# Patient Record
Sex: Male | Born: 1990 | Race: White | Hispanic: No | Marital: Single | Smoking: Never smoker
Health system: Southern US, Community
[De-identification: ages and names within clinical notes are randomized; demographics above are authoritative.]

## PROBLEM LIST (undated history)

## (undated) DIAGNOSIS — F840 Autistic disorder: Secondary | ICD-10-CM

## (undated) DIAGNOSIS — J45909 Unspecified asthma, uncomplicated: Secondary | ICD-10-CM

## (undated) HISTORY — PX: TONSILLECTOMY: SUR1361

---
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
## 2012-12-31 ENCOUNTER — Emergency Department: Payer: Self-pay | Admitting: Emergency Medicine

## 2014-10-16 LAB — LIPID PANEL
CHOLESTEROL: 162 mg/dL (ref 0–200)
HDL: 37 mg/dL (ref 35–70)
LDL Cholesterol: 100 mg/dL
TRIGLYCERIDES: 124 mg/dL (ref 40–160)

## 2014-11-29 DIAGNOSIS — Y998 Other external cause status: Secondary | ICD-10-CM | POA: Diagnosis not present

## 2014-11-29 DIAGNOSIS — Z7951 Long term (current) use of inhaled steroids: Secondary | ICD-10-CM | POA: Diagnosis not present

## 2014-11-29 DIAGNOSIS — Y9389 Activity, other specified: Secondary | ICD-10-CM | POA: Insufficient documentation

## 2014-11-29 DIAGNOSIS — T148 Other injury of unspecified body region: Secondary | ICD-10-CM | POA: Insufficient documentation

## 2014-11-29 DIAGNOSIS — S0993XA Unspecified injury of face, initial encounter: Secondary | ICD-10-CM | POA: Diagnosis present

## 2014-11-29 DIAGNOSIS — Y9289 Other specified places as the place of occurrence of the external cause: Secondary | ICD-10-CM | POA: Diagnosis not present

## 2014-11-29 DIAGNOSIS — S01311A Laceration without foreign body of right ear, initial encounter: Secondary | ICD-10-CM | POA: Insufficient documentation

## 2014-11-30 ENCOUNTER — Emergency Department
Admission: EM | Admit: 2014-11-30 | Discharge: 2014-11-30 | Disposition: A | Discharge status: Home / Self Care | Payer: BLUE CROSS/BLUE SHIELD | Attending: Emergency Medicine | Admitting: Emergency Medicine

## 2014-11-30 ENCOUNTER — Emergency Department: Payer: BLUE CROSS/BLUE SHIELD

## 2014-11-30 ENCOUNTER — Encounter: Payer: Self-pay | Admitting: *Deleted

## 2014-11-30 DIAGNOSIS — S01311A Laceration without foreign body of right ear, initial encounter: Secondary | ICD-10-CM | POA: Diagnosis not present

## 2014-11-30 DIAGNOSIS — T148XXA Other injury of unspecified body region, initial encounter: Secondary | ICD-10-CM

## 2014-11-30 DIAGNOSIS — IMO0002 Reserved for concepts with insufficient information to code with codable children: Secondary | ICD-10-CM

## 2014-11-30 HISTORY — DX: Autistic disorder: F84.0

## 2014-11-30 HISTORY — DX: Unspecified asthma, uncomplicated: J45.909
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
## 2014-11-30 NOTE — ED Notes (Signed)
MD at bedside. 

## 2014-11-30 NOTE — ED Notes (Signed)
Patient transported to CT 
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
## 2014-11-30 NOTE — ED Notes (Signed)
Family at bedside. 

## 2014-11-30 NOTE — ED Notes (Signed)
Pt back in room.

## 2015-09-24 DIAGNOSIS — J45990 Exercise induced bronchospasm: Secondary | ICD-10-CM | POA: Insufficient documentation

## 2015-09-24 DIAGNOSIS — E663 Overweight: Secondary | ICD-10-CM | POA: Insufficient documentation

## 2015-09-24 DIAGNOSIS — F845 Asperger's syndrome: Secondary | ICD-10-CM | POA: Insufficient documentation

## 2015-10-20 ENCOUNTER — Encounter: Payer: Self-pay | Admitting: Family Medicine

## 2015-10-20 ENCOUNTER — Ambulatory Visit (INDEPENDENT_AMBULATORY_CARE_PROVIDER_SITE_OTHER): Payer: BLUE CROSS/BLUE SHIELD | Admitting: Family Medicine

## 2015-10-20 VITALS — BP 112/54 | HR 86 | Temp 97.3°F | Resp 16 | Ht 68.0 in | Wt 204.0 lb

## 2015-10-20 DIAGNOSIS — Z0000 Encounter for general adult medical examination without abnormal findings: Secondary | ICD-10-CM

## 2015-10-20 DIAGNOSIS — F419 Anxiety disorder, unspecified: Secondary | ICD-10-CM

## 2015-10-20 DIAGNOSIS — F39 Unspecified mood [affective] disorder: Secondary | ICD-10-CM

## 2015-10-20 DIAGNOSIS — F845 Asperger's syndrome: Secondary | ICD-10-CM | POA: Diagnosis not present

## 2015-10-20 NOTE — Progress Notes (Signed)
Patient: John [name], Male    DOB: 04/24/1991, 25 y.o.   MRN: [id number] Visit Date: 10/20/2015  Today's Provider: Mila Merry, MD   Chief Complaint  Patient presents with  . Annual Exam  . Asthma   Subjective:    Annual physical exam John [name] is a 25 y.o. male who presents today for health maintenance and complete physical. He feels well. He reports exercising/some. He reports he is sleeping well.  -----------------------------------------------------------------  Follow-up for asthma from 10/16/2014; no changes. Follow-up for overweight from 10/16/2014; recommended diet.  His primary concern today is that he gets very anxious in social interactions and avoids leaving his home because of it. He also has a hard time controlling his temper and angers very easily. He would like to see a counselor to work out these issues. He is working as a part Museum/gallery exhibitions officer and states his work is going fairly well.   Review of Systems  Constitutional: Negative for fever, chills, appetite change and fatigue.  HENT: Negative for congestion, ear pain, hearing loss, nosebleeds and trouble swallowing.   Eyes: Negative for pain and visual disturbance.  Respiratory: Negative for cough, chest tightness and shortness of breath.   Cardiovascular: Negative for chest pain, palpitations and leg swelling.  Gastrointestinal: Negative for nausea, vomiting, abdominal pain, diarrhea, constipation and blood in stool.  Endocrine: Negative for polydipsia, polyphagia and polyuria.  Genitourinary: Negative for dysuria and flank pain.  Musculoskeletal: Negative for myalgias, back pain, joint swelling, arthralgias and neck stiffness.  Skin: Negative for color change, rash and wound.  Neurological: Negative for dizziness, tremors, seizures, speech difficulty, weakness, light-headedness and headaches.  Psychiatric/Behavioral: Negative for behavioral problems, confusion, sleep  disturbance, dysphoric mood and decreased concentration. The patient is not nervous/anxious.   All other systems reviewed and are negative.   Social History      He  reports that he has never smoked. He has never used smokeless tobacco. He reports that he drinks alcohol. He reports that he does not use illicit drugs.       Social History   Social History  . Marital Status: Single    Spouse Name: N/A  . Number of Children: N/A  . Years of Education: N/A   Social History Main Topics  . Smoking status: Never Smoker   . Smokeless tobacco: Never Used  . Alcohol Use: Yes     Comment: rarely  . Drug Use: No  . Sexual Activity: Yes    Birth Control/ Protection: None   Other Topics Concern  . None   Social History Narrative    Past Medical History  Diagnosis Date  . Autism   . Asthma      Patient Active Problem List   Diagnosis Date Noted  . Asperger syndrome 09/24/2015  . Asthma, exercise induced 09/24/2015  . Excess weight 09/24/2015    Past Surgical History  Procedure Laterality Date  . Tonsillectomy      Family History        Family Status  Relation Status Death Age  . Father Alive   . Paternal Grandmother Alive   . Paternal Optometrist   . Mother Alive   . Maternal Grandfather Deceased   . Brother Alive   . Maternal Grandmother Deceased         His family history includes Diabetes in his maternal grandmother and paternal grandfather; Heart attack in his maternal grandmother and paternal grandmother; Heart  disease in his mother and paternal grandfather; Hypertension in his father and mother; Stroke in his mother.    Allergies  Allergen Reactions  . Cefzil [Cefprozil] Rash  . Lorabid [Loracarbef] Rash    Previous Medications   ALBUTEROL-IPRATROPIUM (COMBIVENT) 18-103 MCG/ACT INHALER    Inhale 2 puffs into the lungs every 4 (four) hours.    Patient Care Team: Malva Limesonald E Idy Rawling, MD as PCP - General (Family Medicine)     Objective:   Vitals:  BP 112/54 mmHg  Pulse 86  Temp(Src) 97.3 F (36.3 C) (Oral)  Resp 16  Ht 5\' 8"  (1.727 m)  Wt 204 lb (92.534 kg)  BMI 31.03 kg/m2  SpO2 98%   Physical Exam   General Appearance:    Alert, cooperative, no distress, appears stated age  Head:    Normocephalic, without obvious abnormality, atraumatic  Eyes:    PERRL, conjunctiva/corneas clear, EOM's intact, fundi    benign, both eyes       Ears:    Normal TM's and external ear canals, both ears  Nose:   Nares normal, septum midline, mucosa normal, no drainage   or sinus tenderness  Throat:   Lips, mucosa, and tongue normal; teeth and gums normal  Neck:   Supple, symmetrical, trachea midline, no adenopathy;       thyroid:  No enlargement/tenderness/nodules; no carotid   bruit or JVD  Back:     Symmetric, no curvature, ROM normal, no CVA tenderness  Lungs:     Clear to auscultation bilaterally, respirations unlabored  Chest wall:    No tenderness or deformity  Heart:    Regular rate and rhythm, S1 and S2 normal, no murmur, rub   or gallop  Abdomen:     Soft, non-tender, bowel sounds active all four quadrants,    no masses, no organomegaly  Genitalia:    deferred  Rectal:    deferred  Extremities:   Extremities normal, atraumatic, no cyanosis or edema  Pulses:   2+ and symmetric all extremities  Skin:   Skin color, texture, turgor normal, no rashes or lesions  Lymph nodes:   Cervical, supraclavicular, and axillary nodes normal  Neurologic:   CNII-XII intact. Normal strength, sensation and reflexes      throughout   Depression Screen PHQ 2/9 Scores 10/20/2015  PHQ - 2 Score 1  PHQ- 9 Score 4      Assessment & Plan:     Routine Health Maintenance and Physical Exam  Exercise Activities and Dietary recommendations Goals    None      Immunization History  Administered Date(s) Administered  . Tdap 10/20/2009    Health Maintenance  Topic Date Due  . HIV Screening  10/10/2007  . TETANUS/TDAP  10/10/2011  . INFLUENZA  VACCINE  02/03/2016      Discussed health benefits of physical activity, and encouraged him to engage in regular exercise appropriate for his age and condition.    --------------------------------------------------------------------  1. Annual physical exam   2. Asperger syndrome  - Ambulatory referral to Psychology  3. Acute anxiety  - Ambulatory referral to Psychology  4. Mood disorder (HCC) Discussed medications that help level mood. He prefers to start with counseling.   - Ambulatory referral to Psychology

## 2015-10-28 ENCOUNTER — Ambulatory Visit (INDEPENDENT_AMBULATORY_CARE_PROVIDER_SITE_OTHER): Payer: BLUE CROSS/BLUE SHIELD | Admitting: Licensed Clinical Social Worker

## 2015-10-28 ENCOUNTER — Encounter: Payer: Self-pay | Admitting: Licensed Clinical Social Worker

## 2015-10-28 DIAGNOSIS — F411 Generalized anxiety disorder: Secondary | ICD-10-CM | POA: Insufficient documentation

## 2015-10-28 DIAGNOSIS — F332 Major depressive disorder, recurrent severe without psychotic features: Secondary | ICD-10-CM | POA: Insufficient documentation

## 2015-10-28 NOTE — Progress Notes (Signed)
Comprehensive Clinical Assessment (CCA) Note  10/28/2015 CONNERY SHIFFLER [id number]  Visit Diagnosis:      ICD-9-CM ICD-10-CM   1. Severe episode of recurrent major depressive disorder, without psychotic features (HCC) 296.33 F33.2   2. Generalized anxiety disorder 300.02 F41.1       CCA Part One  Part One has been completed on paper by the patient.  (See scanned document in Chart Review)  CCA Part Two A  Intake/Chief Complaint:  CCA Intake With Chief Complaint CCA Part Two Date: 10/28/15 CCA Part Two Time: 0849 Chief Complaint/Presenting Problem: he gets anxious in social situations and avoid leaving home because of it. He also doesn't have anyone to hang out with except one friend so sitting at home a lot. He has a hard time controlling his temper and angers very easily. 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 He gets depression sometimes.  Patients Currently Reported Symptoms/Problems: Right now it isn't too bad. Sometimes he feels good and sometimes feels bad. At point he was always negative.  Collateral Involvement: Benett Swoyer,  Individual's Strengths: When he has a good day he actually does good.  Individual's Preferences: therapy Individual's Abilities: He is a good gamer Type of Services Patient Feels Are Needed: individual therapy  Initial Clinical Notes/Concerns: He has had a same problem since he was kid. This is his first time in treatment and Dr. Sherrie Mustache referred him.   Mental Health Symptoms Depression:  Depression: Change in energy/activity, Fatigue, Hopelessness, Irritability, Worthlessness (Sometimes feels suicidal, had thoughts about cutting or stabbing  himself two times in the past with most recent a week ago when he held knife to arm but shared with his dad and second thought stopped him. Last year he threatened to stab himself but did not act on thoughts. out cutting himself. His second thoughts stopped him.  denies SIB He relates that these incidents were related to not getting the support and comfort he felt he needed.)  Mania:  Mania: N/A  Anxiety:   Anxiety: Difficulty concentrating, Fatigue, Irritability, Tension, Worrying (Worry daily, interferes with functioning. He worries about the small. Anxious in social situation and avoids. He worries about being evaluated. )  Psychosis:  Psychosis: N/A  Trauma:  Trauma: N/A  Obsessions:  Obsessions:  (He has thoughts that something happened and his fault. He dobule checks before you . He checks the house to make sure he leaves it safe. If something happens in the house he will be concenred that it is his fault. )  Compulsions:     Inattention:  Inattention: N/A  Hyperactivity/Impulsivity:  Hyperactivity/Impulsivity: N/A  Oppositional/Defiant Behaviors:  Oppositional/Defiant Behaviors: N/A  Borderline Personality:  Emotional Irregularity: N/A  Other Mood/Personality Symptoms:      Mental Status Exam Appearance and self-care  Stature:  Stature: Average  Weight:  Weight: Average weight  Clothing:  Clothing: Casual  Grooming:  Grooming: Normal  Cosmetic use:  Cosmetic Use: None  Posture/gait:  Posture/Gait: Normal  Motor activity:  Motor Activity: Not Remarkable  Sensorium  Attention:  Attention: Normal  Concentration:  Concentration: Normal  Orientation:  Orientation: Object, Person, Place, Situation, Time  Recall/memory:  Recall/Memory: Normal  Affect and Mood  Affect:  Affect: Appropriate  Mood:  Mood: Anxious, Depressed  Relating  Eye contact:  Eye Contact: Normal  Facial expression:  Facial Expression: Responsive  Attitude toward examiner:  Attitude Toward Examiner: Cooperative  Thought and Language  Speech flow: Speech Flow: Normal  Thought  content:  Thought Content: Appropriate to mood and circumstances  Preoccupation:     Hallucinations:     Organization:     Company secretary of Knowledge:  Fund of Knowledge: Average   Intelligence:  Intelligence: Average  Abstraction:  Abstraction: Normal  Judgement:  Judgement: Fair  Dance movement psychotherapist:  Reality Testing: Realistic  Insight:  Insight: Fair  Decision Making:  Decision Making: Normal  Social Functioning  Social Maturity:  Social Maturity: Isolates, Responsible  Social Judgement:  Social Judgement: Normal  Stress  Stressors:  Stressors: Family conflict (getting on a time schedule)  Coping Ability:  Coping Ability: Deficient supports (struggles with coping a little, lost contact with a lot of friends from high school. No support except from family. )  Skill Deficits:     Supports:      Family and Psychosocial History: Family history Marital status: Single Are you sexually active?: No What is your sexual orientation?: heterosexual Has your sexual activity been affected by drugs, alcohol, medication, or emotional stress?: no Does patient have children?: No  Childhood History:  Childhood History By whom was/is the patient raised?: Both parents Additional childhood history information: good Description of patient's relationship with caregiver when they were a child: good Patient's description of current relationship with people who raised him/her: it is going just fine. He bonds with his dad How were you disciplined when you got in trouble as a child/adolescent?: grounded a few times, has been spanked in the butt, he lost privileges. He was a pretty good kid Does patient have siblings?: Yes Number of Siblings: 1 Description of patient's current relationship with siblings: Older brother, it is complicated sometimes he has a temper problem, he wants to move out of house because he doesn't want to be around it. When he brings his friends over it makes him jealous on the inside. All he does is play video games, sleep and eat.  Did patient suffer any verbal/emotional/physical/sexual abuse as a child?: No Did patient suffer from severe childhood neglect?:  No Has patient ever been sexually abused/assaulted/raped as an adolescent or adult?: No Was the patient ever a victim of a crime or a disaster?: No Witnessed domestic violence?: No Has patient been effected by domestic violence as an adult?: No  CCA Part Two B  Employment/Work Situation: Employment / Work Psychologist, occupational Employment situation: Employed Where is patient currently employed?: [employer] How long has patient been employed?: 2 years and eight months Patient's job has been impacted by current illness: Yes Describe how patient's job has been impacted: In the last few weeks he felt like isolating from the group and taking his break by himself. this is becuase he is not very social. Sometimes he is friendly.  What is the longest time patient has a held a job?: 2 years and eight months Where was the patient employed at that time?: [employer] Has patient ever been in the [employer]?: No Has patient ever served in combat?: No Did You Receive Any Psychiatric Treatment/Services While in Equities trader?: No Are There Guns or Other Weapons in Your Home?: No  Education: Education Last Grade Completed: 12 Name of High School: [employer] Did Garment/textile technologist From [employer]?: Yes Did Theme park manager?: No Did You Attend Graduate School?: No Did You Have Any Special Interests In School?: He did play football for four years Did You Have An Individualized Education Program (IIEP): Yes Did You Have Any Difficulty At School?: Yes (Some comphrension problems. ) Were  Any Medications Ever Prescribed For These Difficulties?: No  Religion: Religion/Spirituality Are You A Religious Person?: Yes What is Your Religious Affiliation?: Christian How Might This Affect Treatment?: it would be a good thing  Leisure/Recreation: Leisure / Recreation Leisure and Hobbies: video games, draw  Exercise/Diet: Exercise/Diet Do You Exercise?: No Have You Gained or Lost A Significant Amount of Weight in  the Past Six Months?: No Do You Follow a Special Diet?: No Do You Have Any Trouble Sleeping?: No  CCA Part Two C  Alcohol/Drug Use: Alcohol / Drug Use Pain Medications: -n/a Prescriptions: -n/a Over the Counter: -n/a History of alcohol / drug use?: No history of alcohol / drug abuse                      CCA Part Three  ASAM's:  Six Dimensions of Multidimensional Assessment  Dimension 1:  Acute Intoxication and/or Withdrawal Potential:     Dimension 2:  Biomedical Conditions and Complications:     Dimension 3:  Emotional, Behavioral, or Cognitive Conditions and Complications:     Dimension 4:  Readiness to Change:     Dimension 5:  Relapse, Continued use, or Continued Problem Potential:     Dimension 6:  Recovery/Living Environment:      Substance use Disorder (SUD)    Social Function:  Social Functioning Social Maturity: Isolates, Responsible Social Judgement: Normal  Stress:  Stress Stressors: Family conflict (getting on a time schedule) Coping Ability: Deficient supports (struggles with coping a little, lost contact with a lot of friends from high school. No support except from family. ) Patient Takes Medications The Way The Doctor Instructed?: NA Priority Risk: Low Acuity  Risk Assessment- Self-Harm Potential: Risk Assessment For Self-Harm Potential Method: No plan Availability of Means: No access/NA Additional Comments for Self-Harm Potential: Patient has had thoughts at times to harm self but no plan except for a couple of times when one time he held knife to arm and once threatend to stab himself-Last week he held a knife to his arm and at 24 threatened to stab himself. This is because he is not getting the support and acceptance. He told his dad when he felt this way and agrees to tell his dad if he feels this way.   Risk Assessment -Dangerous to Others Potential: Risk Assessment For Dangerous to Others Potential Availability of Means: No access or  NA Intent: Vague intent or NA Notification Required: No need or identified person  DSM5 Diagnoses: Patient Active Problem List   Diagnosis Date Noted  . Major depressive disorder, recurrent episode, severe (HCC) 10/28/2015  . Generalized anxiety disorder 10/28/2015  . Asperger syndrome 09/24/2015  . Asthma, exercise induced 09/24/2015  . Excess weight 09/24/2015    Patient Centered Plan: Patient is on the following Treatment Plan(s):  Anxiety and Depression, social anxiety  Recommendations for Services/Supports/Treatments: Recommendations for Services/Supports/Treatments Recommendations For Services/Supports/Treatments: Individual Therapy  Treatment Plan Summary: Patient is a 25 year old male who was referred by his doctor, Dr. Sherrie MustacheFisher. He relates that he gets anxious in social situations and avoids leaving home because of it. He also doesn't have anyone to hang out with except one friend so sitting at home a lot. He has a hard time controlling his temper and angers very easily. 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 He gets depression sometimes. He endorses depressive symptoms of change in energy/activity, fatigue, hopelessness, irritability, worthlessness. He sometimes feels suicidal. On two  occassions, one last week where he held a knife to his arm but shared this with his dad and once last year when he threatened to stab himself. He agrees to talk to his dad if he has thoughts to harm himself.  He currently does not endorse any suicidal thoughts and no previous SA or SIB.  Patient's anxiety symptoms include difficulty concentrating, fatigue, irritability, tension, worrying (Worries daily, interferes with functioning). He worries about the small things. He is anxious in social situation and avoids. He worries about being evaluated. This is patient's first psychiatric experience. Patient wants to start with individual therapy before starting  medication management. He is recommended for outpatient therapy at least twice month to include but not limited to individual and family therapy. He would benefit from coping strategies to manage depression, anxiety, social anxiety and work on Building surveyor.      Referrals to Alternative Service(s): Referred to Alternative Service(s):   Place:   Date:   Time:    Referred to Alternative Service(s):   Place:   Date:   Time:    Referred to Alternative Service(s):   Place:   Date:   Time:    Referred to Alternative Service(s):   Place:   Date:   Time:     Bowman,Mary A

## 2015-11-06 ENCOUNTER — Ambulatory Visit (INDEPENDENT_AMBULATORY_CARE_PROVIDER_SITE_OTHER): Payer: BLUE CROSS/BLUE SHIELD | Admitting: Licensed Clinical Social Worker

## 2015-11-06 DIAGNOSIS — F332 Major depressive disorder, recurrent severe without psychotic features: Secondary | ICD-10-CM | POA: Diagnosis not present

## 2015-11-06 DIAGNOSIS — F411 Generalized anxiety disorder: Secondary | ICD-10-CM

## 2015-11-06 NOTE — Progress Notes (Signed)
   THERAPIST PROGRESS NOTE  Session Time:  8:50 AM-9:50 AM  Participation Level: Active  Behavioral Response: CasualAlertEuthymic  Type of Therapy: Individual Therapy  Treatment Goals addressed: Anger, Anxiety, Coping and Diagnosis: MDD, recurrent, severe, Generalized Anxiety Disorder  Interventions: CBT, Strength-based, Supportive, Anger Management Training and Other: Rapport building  Summary: John [name] is a 25 y.o. male who presents with this past week was pretty good. Last week he went bowling with his coworkers. He said he feels good when he gets out. He enjoys it. He feels that his job "is pretty good" but would like a full time. Patient enjoys going out with his family. He takes his mom out every time he has a pay check. He gets a little anxious being out with people. Mainly the people he doesn't know. Yesterday was not a good day at work. He felt sad when he got home. He says that triggers for anger are when he feels picked on and when things don't go right. He says that when he gets too angry he has the urge to fight. It doesn't happen too often. He also breaks things in the house. He is able to walk away when angry. He recognzies when he is starting to angrier because heart races and muscle tense. He said that when he sees the signs he is able to tell somebody to stop and walk away. He is avoiding situations that provoke anger. Patient worked on treatment goals and identified that he wants to work on anger management, and improvement of anxiety and depression.     Suicidal/Homicidal: No  Therapist Response: Therapist explored with patient what makes us good and bad days. Discussed that a good goal for him is call somebody to go out with every day since a good day is when he is social. Therapist asked him describe what a bad day was for him. Worked with him on anger management skills as a bad day is when he is frustrated. Explored triggers for depression, anxiety and anger to  help him in developing better coping skills. Also, discussed initiating plan for patient to be more social as a bad day is when he is by himself. Reinforced that he used problem solving skills at work. Asked if he exercises. Encouraged him to ask his friend to exercise. Worked on treatment plan and goals include anger management, improvement of anxiety and depression. Feels that depression will get better with more socialization.   Plan: Return again in 1 weeks. 2.Patient will call friend to go out with this week and will make this regular.3.He will pay attention to what makes him angry and walk away if he gets too mad.   Diagnosis: Axis I: Generalized Anxiety Disorder and Major Depression, Recurrent severe    Axis II: n/a    Bowman,Mary A, LCSW 11/06/2015

## 2015-11-13 ENCOUNTER — Ambulatory Visit (INDEPENDENT_AMBULATORY_CARE_PROVIDER_SITE_OTHER): Payer: BLUE CROSS/BLUE SHIELD | Admitting: Licensed Clinical Social Worker

## 2015-11-13 DIAGNOSIS — F332 Major depressive disorder, recurrent severe without psychotic features: Secondary | ICD-10-CM

## 2015-11-13 DIAGNOSIS — F411 Generalized anxiety disorder: Secondary | ICD-10-CM

## 2015-11-13 NOTE — Progress Notes (Signed)
   THERAPIST PROGRESS NO Session Time: 8:45 AM-9:40 AM  Participation Level: Active  Behavioral Response: CasualAlertEuthymic  Type of Therapy: Individual Therapy  Treatment Goals addressed: Anger, Anxiety, Coping and Diagnosis: MDD, recurrent, severe, Generalized Anxiety Disorder, cope with anxiety and depression as it causes him stress  Interventions: CBT, Strength-based, Supportive and Anger Management Training  Summary: John [name] is a 25 y.o. male who presents with saying that the week went pretty well but that at work yesterday it was rough but then the rest of the day went okay. His new supervisor had him doing things that he did not normally do. He was able to sit down with supervisors and resolve the problem. He learned when something goes wrong to speak up, act fast and tell supervisor. He has not had a problem with anger this week. Reviewed strategies he can use that include walking away to calm down. gs. Talked about ways he can calm down and he finds that walking is good and he identified being able to retreat to the basement. Reviewed depression and anxiety symptoms. He feels sad sometimes but not a whole lot, but then clarified and said that it goes up and down. Relates it is because he feels lonely and identified a thought that sometimes he feels like nobody cares. He did text a friend but he did not text back. Pointed out that he has to expand his social group. Reviewed different ways to meet people including volunteer work, a church group, [employer] and [email or web address]. Patient relates he will consider social medica, will text his friend and possibly church group. 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   Suicidal/Homicidal: No  Therapist Response: therapist reviewed healthy anger management strategies. Discussed that it helps to self-talk as a strategy to calm oneself down and to  help manage anger triggers when he feels picked on or things don't work the way they should work. don't go as planned. Therapist challenged patient's perspective that nobody cares and pointed out that our thoughts can be distorted and not accurate. Provided context that he misperceives because he is at home and brainstormed ways for him to connect with others. Provided supportive interventions and positive reinforcement for following up on plan from last week of calling a friend. Therapist also used strength based approach in identifying patient's interest in work as a positive quality.    Plan: 1.Return again in 1 week. 2.Patient will text a friend again to build social group.3.Patient will make a decision about either social media or church youth group as to what to follow up with in terms of building a social group.   Diagnosis: Axis I: Generalized Anxiety Disorder and Major Depression, Recurrent severe    Axis II: n/a    Linnea Todisco A, LCSW 11/13/2015

## 2015-11-20 ENCOUNTER — Ambulatory Visit (INDEPENDENT_AMBULATORY_CARE_PROVIDER_SITE_OTHER): Payer: BLUE CROSS/BLUE SHIELD | Admitting: Licensed Clinical Social Worker

## 2015-11-20 DIAGNOSIS — F332 Major depressive disorder, recurrent severe without psychotic features: Secondary | ICD-10-CM | POA: Diagnosis not present

## 2015-11-20 DIAGNOSIS — F411 Generalized anxiety disorder: Secondary | ICD-10-CM | POA: Diagnosis not present

## 2015-11-25 ENCOUNTER — Ambulatory Visit (INDEPENDENT_AMBULATORY_CARE_PROVIDER_SITE_OTHER): Payer: BLUE CROSS/BLUE SHIELD | Admitting: Licensed Clinical Social Worker

## 2015-11-25 DIAGNOSIS — F411 Generalized anxiety disorder: Secondary | ICD-10-CM | POA: Diagnosis not present

## 2015-11-25 DIAGNOSIS — F332 Major depressive disorder, recurrent severe without psychotic features: Secondary | ICD-10-CM | POA: Diagnosis not present

## 2015-11-25 NOTE — Progress Notes (Signed)
   THERAPIST PROGRESS NOTE  Session Time: 60min  Participation Level: Active  Behavioral Response: CasualAlertAnxious  Type of Therapy: Individual Therapy  Treatment Goals addressed: Communication: social skills and Coping  Interventions: CBT, Motivational Interviewing, Solution Focused and Supportive  Summary: John [name] is a 25 y.o. male who presents with symptoms of his diagnosis.  Client discussed examples of situations where there are difficulties with boundaries and boundary setting and self-assertion.  Factors that contribute to client's ongoing depressive symptoms were discussed and include real and perceived feelings of isolation, criticism, rejection, shame and guilt.    Suicidal/Homicidal: Nowithout intent/plan  Therapist Response: LCSW provided Patient with ongoing emotional support and encouragement.  Normalized her feelings.  Encouraged to meet have a conversation with  one person this week.  Plan: Return again in 1 weeks.  Diagnosis: Axis I: Generalized Anxiety Disorder and Major Depression, Recurrent severe    Axis II: No diagnosis    Marinda Elkicole M Alera Quevedo, LCSW 11/26/2015

## 2015-11-25 NOTE — Progress Notes (Signed)
   THERAPIST PROGRESS NOTE  Session Time: 60min  Participation Level: Active  Behavioral Response: CasualAlertAnxious  Type of Therapy: Individual Therapy  Treatment Goals addressed: Coping and Diagnosis: Depression  Interventions: CBT, Motivational Interviewing, Supportive and Reframing  Summary: John [name] is a 25 y.o. male who presents with symptoms of his diagnosis.  Discussion of his history and current symptoms.  Review of his previous sessions and what he is currently working on and wants to work toward.  Review of his personal goals.  Discussion of his anger/temper.   Suicidal/Homicidal: Nowithout intent/plan  Therapist Response: LCSW provided Patient with ongoing emotional support and encouragement.  Normalized his feelings.  Commended Patient on his progress and reinforced the importance of client staying focused on his own strengths and resources and resiliency. Processed various strategies for dealing with stressors.    Plan: Return again in 1 weeks.  Diagnosis: Axis I: Generalized Anxiety Disorder    Axis II: No diagnosis    Marinda Elkicole M Amilia Vandenbrink, LCSW 11/21/2015

## 2015-12-04 ENCOUNTER — Ambulatory Visit (INDEPENDENT_AMBULATORY_CARE_PROVIDER_SITE_OTHER): Payer: Medicaid Other | Admitting: Licensed Clinical Social Worker

## 2015-12-04 DIAGNOSIS — F411 Generalized anxiety disorder: Secondary | ICD-10-CM

## 2015-12-09 NOTE — Progress Notes (Signed)
   THERAPIST PROGRESS NOTE  Session Time: 30min  Participation Level: Minimal  Behavioral Response: CasualAlertAnxious  Type of Therapy: Individual Therapy  Treatment Goals addressed: Communication: styles and Diagnosis: anxiety  Interventions: CBT, Motivational Interviewing, Solution Focused, Supportive, Family Systems and Reframing  Summary: Markus DaftChristopher D Cowin is a 25 y.o. male who presents with continued symptoms of his diagnosis.  Assisted with assessing happiness, sadness, excitement, anger, disgust, and fear.  Patient explained details of their week including current stressors and worries. Discussion of how his anxiety interferes with his personal life. Patient continues to be apprehensive about talking due to change in therapist.  At times Patient becomes agitated and attempts to call his mother to avoid responding to therapist. Patient denies attempts at communicating with people in the community.  Patient refused to role play.  Patient states that he wants to continue with the previous therapist.    Suicidal/Homicidal: Nowithout intent/plan  Therapist Response: LCSW provided Patient with ongoing emotional support and encouragement.  Processed various strategies for dealing with stressors.    Plan: Return again in 1 weeks.  Diagnosis: Axis I: Generalized Anxiety Disorder    Axis II: No diagnosis    Marinda Elkicole M Peacock, LCSW 12/05/2015

## 2015-12-11 ENCOUNTER — Ambulatory Visit: Payer: No Typology Code available for payment source | Admitting: Licensed Clinical Social Worker

## 2016-02-05 ENCOUNTER — Ambulatory Visit (INDEPENDENT_AMBULATORY_CARE_PROVIDER_SITE_OTHER): Payer: BLUE CROSS/BLUE SHIELD | Admitting: Licensed Clinical Social Worker

## 2016-02-05 DIAGNOSIS — F411 Generalized anxiety disorder: Secondary | ICD-10-CM

## 2016-02-05 DIAGNOSIS — F332 Major depressive disorder, recurrent severe without psychotic features: Secondary | ICD-10-CM

## 2016-02-05 NOTE — Progress Notes (Signed)
THERAPIST PROGRESS NOTE  Session Time: 10:00 AM-10:55 AM  Participation Level: Active  Behavioral Response: CasualAlertflat affect  Type of Therapy: Individual Therapy  Treatment Goals addressed:  Anger, Anxiety, Coping and Diagnosis: MDD, recurrent, severe, Generalized Anxiety Disorder, cope with anxiety and depression as it causes him stress  Interventions: DBT, Strength-based, Anger Management Training and Family Systems, skills to develop self-esteem  Summary: John [name] is a 25 y.o. male who presents with saying that things are going pretty well. This Monday at work, he was frustrated about making a mistake at work. He took at break outside to help him with his frustrations. In terms of socially, recently he has met some new people who are starting at [employer]. He says he as no issues currently with his job. Reviewed issues with anger. In July he got upset with dad and brother. He recognizes that his trigger is when things don't go the way he think that they should. He got upset and he threw a boot at the wall. He left the situation and talked to mom and she helped calm him down. He realizes he has to work on controlling his reaction and something he is going to set his mind to every day. Related that it bothers him that parents separated. He realizes that if he was a little more open that this would help him socially. He realizes that he is more to him self. Shared in July he had suicidal thoughts, when he stopped therapy. He said that at times that he does not feel good about himself and that people would be better off without him. He agrees to share with therapist and dad if he is having these thoughts. Discussed that patient needs to implement in daily schedule activities that make him feel good and that he needs to put effort into it.   Suicidal/Homicidal: Patient reported suicidal thoughts last month. He does not endorse current SI and agrees to talk to therapist or father if  he has these thoughts.   Therapist Response: Therapist pointed out that none of [place] are perfect and that we all have to accept we will make mistakes and trying to be perfect is a losing battle. Discussed that recognizing that we make mistakes helps to put ourselves in situations that will help [place] to grow. Continue to work with patient on anger management and work with him on slowing down his response when he gets angry so that he can implement anger management skills. Gave positive feedback for the skills that patient is using to manage anger. Challenged patient on negative thinking and distortions in thinking. Explained that patient is engaging in thinking things will be permanent which is a distortion. Challenged patient on negative thoughts about self and that he has to work on thinking more positively about himself. Reviewed suicidal thoughts that patient had in the past month. Challenged distortions in thinking that led to these thoughts. Pointed out that people in his life cared about him. Developed a plan that patient will tell his father or therapist if he is having these thoughts. Helped patient process feelings related to separation of parents and build insight that relationship with parents was something they had to figure out between themselves at the same time to help patient realize that their relationship with him would not change because of their issues. Discussed the important of creating activities that increase positive emotions.    Plan: Return again in 1 week.2.Patient work on Child psychotherapist.3.Patient work on self-esteem.4.Patient  work on engaging in activities that increase positive emotions.   Diagnosis: Axis I:  Generalized Anxiety Disorder and Major Depression, Recurrent severe    Axis II: No diagnosis    Bowman,Mary A, LCSW 02/05/2016

## 2016-02-12 ENCOUNTER — Ambulatory Visit (INDEPENDENT_AMBULATORY_CARE_PROVIDER_SITE_OTHER): Payer: BLUE CROSS/BLUE SHIELD | Admitting: Licensed Clinical Social Worker

## 2016-02-12 DIAGNOSIS — F411 Generalized anxiety disorder: Secondary | ICD-10-CM

## 2016-02-12 DIAGNOSIS — F332 Major depressive disorder, recurrent severe without psychotic features: Secondary | ICD-10-CM

## 2016-02-12 NOTE — Progress Notes (Addendum)
   THERAPIST PROGRESS NOTE  Session Time: 10 AM to 10:55 AM  Participation Level: Active  Behavioral Response: CasualAlertEuthymic  Type of Therapy: Individual Therapy  Treatment Goals addressed:   Anger, Anxiety, Coping and Diagnosis: MDD, recurrent, severe, Generalized Anxiety Disorder, cope with anxiety and depression as it causes him stress  Interventions: Strength-based, Supportive and Social Skills Training  Summary: John [name] is a 25 y.o. male who presents with saying things have been pretty good. Mom mom says he is doing a pretty good job at [employer] and that helps him feel good. This will be something ongoing. He shared pictures of his 2 dogs and said that spending time with them create positive emotions. Hours have increased at work and is now going 30 hours and says he likes going to work. Talked about how this is good for him.Talked to him about and that it shows shows his strengths in being a good worker and motivationto go to work. He could not identify any special interests at this point to join a club and said he talked to his mom about services for autism and wants to put that off for now. He feels following up with people he knows will be a good way to develop friendships. Discussed self nurturing activities and he says he is saving money and takes parents out to eat. They did role play and did very well in understanding empathy and described ways has been empathetic in his own life. He also did well in understanding concepts presented in social skills handout.   Suicidal/Homicidal: No  Therapist Response: Reviewed resources that could be helpful to patient  with his issues that includes [employer] [place] [employer](through [employer]) where patient could be assigned a Microbiologistegistry Coordinator,  [employer] that offers a support group, and [employer] [place] where patient could be assigned a case Production designer, theatre/television/filmmanager. 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 Pointed out it was a sign of progress as well is keeping patient busy and providing opportunities to socialize. Encouraged patient to follow-up with friends he is running into to build relationships. Role played with patient to help patient practice this skill of empathy and discussed how that could be helpful in connecting with people. Reviewed as handout for building social skills for adults with autism. Therapist suggested joining a club group would be a way to meet people he shared similar interest or online where he could find a group where he shared similar interest or experiences. Reviewed with M.D. site and help patient normalize feelings around masturbating.  Plan: Return again in 1week.2. Continue to use role play activities to help patient work on Pharmacist, communitysocial skills.3. Patient continued to learn and implement anger management strategies 4. Therapist continue to use strength-based interventions. 5 patient take steps to continue to build support network  Diagnosis: Axis I:  Generalized Anxiety Disorder and Major Depression, Recurrent severe    Axis II: No diagnosis    Bowman,Mary A, LCSW 02/12/2016

## 2016-02-19 ENCOUNTER — Ambulatory Visit (INDEPENDENT_AMBULATORY_CARE_PROVIDER_SITE_OTHER): Payer: BLUE CROSS/BLUE SHIELD | Admitting: Licensed Clinical Social Worker

## 2016-02-19 DIAGNOSIS — F411 Generalized anxiety disorder: Secondary | ICD-10-CM

## 2016-02-19 DIAGNOSIS — F332 Major depressive disorder, recurrent severe without psychotic features: Secondary | ICD-10-CM

## 2016-02-19 NOTE — Progress Notes (Signed)
   THERAPIST PROGRESS NOTE  Session Time: 9 AM to 9:55 AM  Participation Level: Active  Behavioral Response: CasualAlertEuthymic  Type of Therapy: Individual Therapy  Treatment Goals addressed:  nger, Anxiety, Coping and Diagnosis: MDD, recurrent, severe, Generalized Anxiety Disorder, cope with anxiety and depression as it causes him stress  Interventions: CBT, Solution Focused, Strength-based and Anger Management Training  Summary: John [name] is a 25 y.o. male who presents with no reports of symptoms. He relates his mood goes up and down. If he is feeling good if he is feeling good he is at an 8 and if not feeling good a 4.  Reviewed anger worksheet on dealing with anger. Patient explains that when things don't go the right way he gets angry. He relates that when he gets angry he gets the urge to fight and that he tenses up and his heart starts beating faster. He relates to get rid of his angry feelings he takes time out her goes to a separate room. Patient identified destructive ways he has dealt with anger including physical aggression and getting an attitude with people. He relates some positive or constructive ways he has dealt with anger include walking away, chilling out and talking to somebody about what happened. Patient also watched a videos that help teach social skills including using open-ended questions, strategies to start a conversation and recognizes one's in others' personal space. Therapist role played with patient so he could practice using social skills. Patient shared as well that tomorrow will be his third year anniversary at work.  Suicidal/Homicidal: No  Therapist Response: Reiewed a  worksheet titled "Dealing with Anger" to help patient reflect on the positive and negative ways he deals with anger. Therapist asked patient to think about his thought that "things don't go the right way" and realize that his way may not be the same way as other people. Suggested a  better alternative is to discuss his frustration to work though a solution that they both can agree with. Related that we want to work on managing patient's anger so he does not lose control. Anger is a natural feeling but find ways that are healthier and more constructive to manage anger. Encourged patient to think about slowing down and taking it easy would be beneficial before he gets angry. Started a worksheet titled "anger management strategies" strategies so patient can learn more strategies that can be effective. They included finding ways to immediately lacks, changing angry thoughts by self talk, silently wanting to 10, recognize situations that trigger her anger, tune into himself to recognize warning signs, exercise and removing oneself from a situation. Presented videos to patient to help him learn effective strategies for social skills and role-played so that he could practice. Therapist provided strength-based interventions  Plan: Return again in 1 week.2. Patient work on and apply Building surveyoranger management and social skills.  Diagnosis: Axis I:  Generalized Anxiety Disorder and Major Depression, Recurrent severe    Axis II: No diagnosis    Bowman,Mary A, LCSW 02/19/2016

## 2016-02-26 ENCOUNTER — Ambulatory Visit (INDEPENDENT_AMBULATORY_CARE_PROVIDER_SITE_OTHER): Payer: BLUE CROSS/BLUE SHIELD | Admitting: Licensed Clinical Social Worker

## 2016-02-26 DIAGNOSIS — F411 Generalized anxiety disorder: Secondary | ICD-10-CM | POA: Diagnosis not present

## 2016-02-26 DIAGNOSIS — F332 Major depressive disorder, recurrent severe without psychotic features: Secondary | ICD-10-CM | POA: Diagnosis not present

## 2016-02-26 NOTE — Progress Notes (Signed)
   THERAPIST PROGRESS NOTE  Session Time: 2 PM to 2:50 PM  Participation Level: Active  Behavioral Response: CasualAlertEuthymic   Type of Therapy: Individual Therapy, collateral information was obtained through Conversation with Mom in session  Treatment Goals addressed:  Anger, Anxiety, Coping and Diagnosis: MDD, recurrent, severe, Generalized Anxiety Disorder, cope with anxiety and depression as it causes him stress   Interventions: Solution Focused, Strength-based, Supportive, Anger Management Training,   Summary: John [name] is a 25 y.o. male who presents with relating that he had a good week rate his mood as a 9 out of 10. He related he had no anger episodes. Therapist was able to talk to his mom in session by phone to help with strategies for patient to build supports. She will encourage patient to spend time with brother but also relates one of the issues in the relationship is patient's anger so patient needs to continue to address in session to help with his relationships. Brainstormed about other ways patient can develop supports and mom suggested that patient go to other churches where he would find a youth group. Patient did not have a positive experience with support groups with people who have autism. Therapist suggested that patient find an interest that he could join a group but acknowledged with mom that it's difficult for lots of people to me people. Mom talked about how much patient is overcoming his life to get to where he is now. Related that he is very sensitive and can react deeply to things. Discussed with patient after phone conversation issues with anger. He says that he tries to control anger but sometimes he just can't. Discussed his triggers and relates that related to his brother, he has an attitude problem that triggers his anger. Reviewed that when things don't go the way they should is one of his main triggers. Patient was able to recall from previous  session recognition that he is not God and can't control everything. He said that he will remind himself of this to help and de-escalating anger. Patient was also encouraged to exercise.   Suicidal/Homicidal: No  Therapist Response: Therapist was able to get collateral information from mom through phone conversation. Therapist involved mom so she could he involved in goal of helping patient to develop supports. She also was able to review patient's progress and the need to both work on anger management and to continue to work on helping him develop a support system. Reviewed triggers with patient and worked on worksheet titled "anger management strategies ". Discussed strategies the patient could use when he is triggered so that he will be better able to manage them. Discussed self-talk to change perspective, taking a deep breath and walking away is similar strategies. Provided positive feedback to reinforce progress and as a strength-based intervention.  Plan: Return again in weeks.2. Patient continue to take positive steps to progress in managing emotions.3. Patient reports one positive coping strategy he implemented from session  Diagnosis: Axis I:   Generalized Anxiety Disorder and Major Depression, Recurrent severe    Axis II: No diagnosis    Gadiel John A, LCSW 02/26/2016

## 2016-03-04 ENCOUNTER — Ambulatory Visit (INDEPENDENT_AMBULATORY_CARE_PROVIDER_SITE_OTHER): Payer: BLUE CROSS/BLUE SHIELD | Admitting: Licensed Clinical Social Worker

## 2016-03-04 ENCOUNTER — Ambulatory Visit: Payer: No Typology Code available for payment source | Admitting: Licensed Clinical Social Worker

## 2016-03-04 DIAGNOSIS — F332 Major depressive disorder, recurrent severe without psychotic features: Secondary | ICD-10-CM

## 2016-03-04 DIAGNOSIS — F411 Generalized anxiety disorder: Secondary | ICD-10-CM | POA: Diagnosis not present

## 2016-03-04 NOTE — Progress Notes (Addendum)
   THERAPIST PROGRESS NOTE  Session Time: 2 PM to 2:55 PM  Participation Level: Active  Behavioral Response: CasualAlertEuthymic  Type of Therapy: Individual Therapy  Treatment Goals addressed:  Anger, Anxiety, Coping and Diagnosis: MDD, recurrent, severe, Generalized Anxiety Disorder, cope with anxiety and depression as it causes him stress  Interventions: Solution Focused, Strength-based, Supportive and Anger Management Training  Summary: John [name] is a 25 y.o. male who presents with doing well except for being stressed out a couple of times at work. He said that he dealt with it the best he could and pretty well. Mom still has to talk to his brother about hanging out but patient feels he is making progress in talking to people. He says that he is opening up more at work and it has been positive. Continued with anger management handout. Brought up situations that cause him to be angry and worked on Cytogeneticistanger management plan. Brought up situation with dog and it bothers him that his brother is so rough on a dog. Discussed a strategy that he is going to talk to dad. Discussed as well recognition that his brother has his own anger issues to deal with to help in developing a helpful perspective to manage anger. Identified brother as anger and temperament problem. Patient discussed with therapist concerns about getting into a relationship. Patient brought up some concerns about masturbation. Therapist looked on website Healthline to help educate and normalize  Suicidal/Homicidal: No  Therapist Response: Gave patient positive feedback about talking with supports as a helpful way to manage anger. Normalized anger is emotion that everyone has and what matters is how we deal with it. Discussed that we can either handle it constructively or destructively and discuss that anger has a benefit in conveying insights about yourself and others. Therapist encouraged patient to practice self-compassion in  managing situations that are frustrating and recognize in life that things are always going the way we expect them. Discussed source of anger is lack of control and worked on building insight that we have to recognize limited control in helping to manage anger. Reviewed anger management plans with patient to help him, as a strategy to manage anger and encouraged him to see unhelpful strategies can make the situation worse. Worked on interpersonal skills related to situations that trigger anger. Discussed healthy relationship skills with patient. Reviewed education on masturbation to help normalize. Provided positive feedback for noted progress she reported making  Plan: Return again in 2 weeks.2. Patient continue to work on anger management and social skills  Diagnosis: Axis I:  Generalized Anxiety Disorder and Major Depression, Recurrent severe    Axis II: No diagnosis    Shavar Gorka A, LCSW 03/04/2016

## 2016-03-11 ENCOUNTER — Ambulatory Visit (INDEPENDENT_AMBULATORY_CARE_PROVIDER_SITE_OTHER): Payer: BLUE CROSS/BLUE SHIELD | Admitting: Licensed Clinical Social Worker

## 2016-03-11 DIAGNOSIS — F411 Generalized anxiety disorder: Secondary | ICD-10-CM | POA: Diagnosis not present

## 2016-03-11 DIAGNOSIS — F332 Major depressive disorder, recurrent severe without psychotic features: Secondary | ICD-10-CM

## 2016-03-11 NOTE — Progress Notes (Signed)
   THERAPIST PROGRESS NOTE  Session Time: 2:03 PM to 2:55 PM  Participation Level: Active  Behavioral Response: CasualAlertEuthymic  Type of Therapy: Individual Therapy  Treatment Goals addressed:  Anger, Anxiety, Coping and Diagnosis: MDD, recurrent, severe, Generalized Anxiety Disorder, cope with anxiety and depression as it causes him stress  Interventions: CBT, Solution Focused, Strength-based, Supportive, Reframing and Other: Practicing social skills, emotion regulation skills, strategies to work on self-esteem  Summary: John [name] is a 25 y.o. male who presents with doing pretty good. He reports no triggers for anger. Discussed worksheet "Pleasant Activities Tip Sheet" and talked about putting fun activities into his life. He likes [employer] golf and participating with outings at work. Discussed regular activities he enjoys including  walking the dog, has a bike, sitting outside and talked about sports team he likes. Introduced cards to help patient socialize and discuss different topics more in depth brought up by questions on the cards. He talked about wanting to be more future oriented as at times he still living in the past by still thinking about some disappointments from his past. Described himself as being perceived as dull because of staying to himself  and not socializing. He wishes he were more social but hesitant to practice suggested activity of practicing social skills and related that he is being more open at work so he is progressing. Discussed opportunity is spending time with people at work but is wants to make sure that they're not bad influences. Patient brought up topic of relationships and still has the attitude of it not being worth it for him. Discussed ways relationships could be positive, it's important to make good decisions about who to be with and not missing out on positive opportunities because of fears.    Suicidal/Homicidal: No  Therapist Response:  Reviewed "Pleasant Activities Tip Sheet and discussed that mood improves if you add some pleasant activities to the routine. Discussed that it's good to plan for at least one pleasant activity per day, even if just for a few minutes and look for activities that bring you joy and are simple. Reviewed activities that bring patient joy. Introduced cards with different topics so that patient could practice social skills. Explored clinical issues through use of the cards. Work with patient on reframing disappointments from the past and encourage him to see ways that made them stronger and more motivated to reach goals. Normalized patient's feelings around disappointment. Challenged patient on self perception through evidence that contradicts perception. Encouraged patient in practicing social skills and provided feedback for him to not let fears caused him to miss out on opportunities. Provided positive feedback for patient's practicing caution and judgment in choosing healthy relationships. Encouraged patient activities to practice socializing. Provided strength based and supportive interventions.  Plan: Return again in 1 weeks.2. Patient will utilize insights gained in therapy to help him management and mood and progress and implementing social skills.   Diagnosis: Axis I:  Generalized Anxiety Disorder and Major Depression, Recurrent severe    Axis II: No diagnosis    Allin Frix A, LCSW 03/11/2016

## 2016-03-18 ENCOUNTER — Ambulatory Visit (INDEPENDENT_AMBULATORY_CARE_PROVIDER_SITE_OTHER): Payer: BLUE CROSS/BLUE SHIELD | Admitting: Licensed Clinical Social Worker

## 2016-03-18 DIAGNOSIS — F411 Generalized anxiety disorder: Secondary | ICD-10-CM

## 2016-03-18 DIAGNOSIS — F332 Major depressive disorder, recurrent severe without psychotic features: Secondary | ICD-10-CM

## 2016-03-18 NOTE — Progress Notes (Signed)
THERAPIST PROGRESS NOTE  Session Time: 2 PM to 2:52 PM  Participation Level: Active  Behavioral Response: CasualAlertEuthymic  Type of Therapy: Individual Therapy  Treatment Goals addressed:  Anger, Anxiety, Coping and Diagnosis: MDD, recurrent, severe, Generalized Anxiety Disorder, cope with anxiety and depression as it causes him stress   Interventions: CBT, Solution Focused, Strength-based, Supportive, Reframing and Other: Work on Water quality scientistbuilding social skills, skills to work on Librarian, academicfrustration and stress management  Summary: John [name] is a 25 y.o. male who presents with relating that last week when well but brought up issue that yesterday the air conditioner went out at mom's. At first frustrated but then calmed down and had patience. He thought he was going to have the take the dog and go to dad's house. He related that he handled this better than he would've before. He recognizes that things not going the way they're supposed to is a trigger for him. Reviewed worksheet to help gain coping strategies called "13 helping points when things don't go your way". Discussed self-talk that is helpful to cope. Dicussed that this is going to happen in life fairly often and the reality that we are going to have face situations where things don't go our way such as when things break down or accidents such as forgetting to bring her things with you. It is useful in these situations that can be helpful provided that ones that work against you. Patient related that he learn from worksheet that  lot of times in life things doen't the way they supposed are to. Therapist related that this is a perspective that is helpful in coping in these type situations. Discussed positive activities of last week and he got to watch his favorite football team play. Related as well that he has somebody at work to hang out and they said to call them. Patient recognizes that this is progress and working on developing  friendships and support.  Suicidal/Homicidal: No  Therapist Response: Discussed that self-talk will help him through times when he is triggered that things are going the way they are supposed. Related that can't can't control everything in life, everything isn't perfect and getting frustrated isn't going to help. Therapist pointed out that he is going to face that situation fairly regularly just because of the reality that things happen and don't go your way. Shared that having a good perspective helps with a good approach. Reviewed worksheet "13 helping points when things don't go your way"to help gets some ideas on how to deal with setbacks. Suggestions included talking to someone to get an alternative persective. Discussed how setbacks build character. Focus on actionable stops and ask what he has learned from the problem. Worked on normalizing patients experience with dealing with unexpected problems. Played card game that introduced different topics as a way to discuss clinical issues and practice social skills. Continue to challenged patient on negative self perception. Provided positive feedback related to positive steps patient has taken in building support and in progress he is making in managing his triggers.  Plan: Return again in 2 weeks.2 patient continued to learn and apply coping strategies to manage triggers.3. Patient continue to take steps to work on building social skills.3. Patient will coordinate Mom joined session to provide support for patient and implementing coping strategies and to provide feedback on progress  Diagnosis: Axis I:   Anger, Anxiety, Coping and Diagnosis: MDD, recurrent, severe, Generalized Anxiety Disorder, cope with anxiety and depression as it causes him stress  Axis II: No diagnosis    Bowman,Mary A, LCSW 03/18/2016

## 2016-03-25 ENCOUNTER — Ambulatory Visit (INDEPENDENT_AMBULATORY_CARE_PROVIDER_SITE_OTHER): Payer: BLUE CROSS/BLUE SHIELD | Admitting: Licensed Clinical Social Worker

## 2016-03-25 DIAGNOSIS — F411 Generalized anxiety disorder: Secondary | ICD-10-CM

## 2016-03-25 DIAGNOSIS — F332 Major depressive disorder, recurrent severe without psychotic features: Secondary | ICD-10-CM

## 2016-03-25 NOTE — Progress Notes (Signed)
   THERAPIST PROGRESS NOTE  Session Time: 2:05 PM to 3 PM  Participation Level: Minimal, mom shared for majority of session with minimal input from patient  Behavioral Response: CasualAlertEuthymic  Type of Therapy:  session with patient and mom who provided input to help and working on treatment goals  Treatment Goals addressed: Anger, Anxiety, Coping and Diagnosis: MDD, recurrent, severe, Generalized Anxiety Disorder, cope with anxiety and depression as it causes him stress  Interventions: Solution Focused, Strength-based, Supportive, Anger Management Training, Family Systems, Reframing and Other: Skills in interpersonal effectiveness  Summary: Markus DaftChristopher D Kuba is a 25 y.o. male who presents with mom to give feedback as to progress and identify any issues that need addressed. Mom noted progress in saying that patient is taking steps to advocate for himself, communicating and not getting upset. She pointed out that he has recently done that at work. Pointed out that this is a valuable life skill and when we all have to develop. Mom related that patient has worked on Research officer, political partydeveloping social skills because he wanted to be like everyone else. He watches people and is attuned to them. She says it's harder now for him because he is not in his school environment and not around people his age. Brainstormed about ways he could connect with people including friends on [employer]. Related that it can be hard for anyone to develop relationships when they're no longer in an environment where people is easy. Mom feels it would be good for patient to develop relationship with brother and that they love each other. Patient shared he is recently touch base with person from work who is not getting back to him. Discussed that we have to learn not to personalize and only valuing relationships where the person values us. Discussed patient's feeling of not wanting to be in the relationship because he does not want is hard  to be broken. Shared how relationships are worth investing in and even though heart can be broken we learn, grow and ultimately when we love life is more fulfilling.   Suicidal/Homicidal: No  Therapist Response: Held session with patient and patient's mom and reviewed patient's progress. Identified patient's steps to advocate for himself and indication of progress. Mom is going to encourage for patient and his brother to spend time together and reviewed triggers so patient would be able to manage frustrations with his brother better. Encouraged patient to be more like brother and then a parent to help him with frustrations. Discussed the value of taking risks and the value of being in relationships. Related that patient can work on himself while single and realized his life can be fulfilling by what he brings to his life. Reviewed anger management strategies that include stepping back and walking away until one is calmer, recognizing when he starts to get angry or so he can use coping strategies, and getting context by thinking about that makes you angry more rationally than emotionally taking. Related that anger is normal for everyone and we have to find ways to handle it constructively rather than destructively. Used strength and supportive interventions.  Plan: Return again in 2 weeks.2. Patient work on Building surveyoranger management and social skills.  Diagnosis: Axis I: Anger, Anxiety, Coping and Diagnosis: MDD, recurrent, severe, Generalized Anxiety Disorder, cope with anxiety and depression as it causes him stress    Axis II: No diagnosis    Rhyan Wolters A, LCSW 03/25/2016

## 2016-04-08 ENCOUNTER — Ambulatory Visit: Payer: No Typology Code available for payment source | Admitting: Licensed Clinical Social Worker

## 2016-04-15 ENCOUNTER — Ambulatory Visit (INDEPENDENT_AMBULATORY_CARE_PROVIDER_SITE_OTHER): Payer: BLUE CROSS/BLUE SHIELD | Admitting: Licensed Clinical Social Worker

## 2016-04-15 DIAGNOSIS — F332 Major depressive disorder, recurrent severe without psychotic features: Secondary | ICD-10-CM | POA: Diagnosis not present

## 2016-04-15 DIAGNOSIS — F411 Generalized anxiety disorder: Secondary | ICD-10-CM | POA: Diagnosis not present

## 2016-04-15 NOTE — Progress Notes (Signed)
   THERAPIST PROGRESS NOTE  Session John: 3 PM to 3:55 PM  Participation Level: Active  Behavioral Response: CasualAlertEuthymic  Type of Therapy: Individual Therapy  Treatment Goals addressed:  Anger, Anxiety, Coping and Diagnosis: MDD, recurrent, severe, Generalized Anxiety Disorder, cope with anxiety and depression as it causes him stress  Interventions: CBT, Solution Focused, Strength-based, Supportive, Anger Management Training and Other: Compassion-based therapy  Summary: John [name] is [name] 25 y.o. male who presents with not feeling so good today. [name] lot of times at work he doesn't feel like [name] friendly person even though he should be. He is tired and got up early for work this week. He is willing to just say hi to people to be friendly even though irritable. Brought up topic of masturbating and said that it can make him feel depressed and aggressive and thinks he needs to cut down. He thinks that giving it up can make him more energetic and productive. He discussed with his dad he said he should give it up if it doesn't make him feel good. Patient still is thinking about what he wants to do and if he wants to stop. Reviewed John [name] talk by John [name] titled "Anger, Compassion, and what it means to be strong" and discussed how compassion is courageous and compassion for herself as [name] strategy to manage anger. Discussed the strategies suggested in video that includes take [name] moment to be compassionate to self to notice what is happening and understand why you are struggling, and then slow things down. The video suggested deep breathing as body will help slow down mind and that we don't have to act even though her anger may tell us this. Then work on emotions before taking action. Patient applied strategy to [name] past situation and he describes he will tell himself "it's not [name] big deal and move on". Therapist emphasized being compassionate as well with himself  Suicidal/Homicidal:  No  Therapist Response: Reviewed patient's current symptoms an normalized his irritablity is something people feel from John to John and discussed strategies so he won't take that out on anyone else. Explained that patient has to accept he is not perfect and feeling of irritability are normal. Provided psychoeducation about masturbation, normalized and reviewed pros and cons and helping patient come to decision about it. Reviewed John [name] talk on "Anger, Compassion, and what it means to be strong" by John [name]. Discussed  compassion as [name] means to be strong and how compassion towards self is [name] helpful strategy in managing anger. Identified patient's core beliefs of not feeling good enough but at the same John discussing his perfectionist standards as impossible to me. Provided supportive and strength-based interventions.  Plan: Return again in 1 weeks.2. Patient work on strategies to improve on self-esteem.2. Patient gained insight and implement strategies to manage anger  Diagnosis: Axis I:  Anger, Anxiety, Coping and Diagnosis: MDD, recurrent, severe, Generalized Anxiety Disorder, cope with anxiety and depression as it causes him stress    Axis II: No diagnosis    John [name] A, LCSW 04/15/2016

## 2016-04-22 ENCOUNTER — Ambulatory Visit (INDEPENDENT_AMBULATORY_CARE_PROVIDER_SITE_OTHER): Payer: BLUE CROSS/BLUE SHIELD | Admitting: Licensed Clinical Social Worker

## 2016-04-22 DIAGNOSIS — F332 Major depressive disorder, recurrent severe without psychotic features: Secondary | ICD-10-CM | POA: Diagnosis not present

## 2016-04-22 DIAGNOSIS — F411 Generalized anxiety disorder: Secondary | ICD-10-CM | POA: Diagnosis not present

## 2016-04-22 NOTE — Progress Notes (Addendum)
   THERAPIST PROGRESS NOTE  Session Time: 2 PM to 2:50 PM  Participation Level: Active  Behavioral Response: CasualAlertEuthymic  Type of Therapy: Individual Therapy  Treatment Goals addressed:  Anger, Anxiety, Coping and Diagnosis: MDD, recurrent, severe, Generalized Anxiety Disorder, cope with anxiety and depression as it causes him stress  Interventions: Solution Focused, Strength-based, Supportive, Anger Management Training, Psychologist, occupationalocial Skills Training and Other: Skills to build self-esteem, emotional regulation  Summary: John DaftChristopher D [name] is a 25 y.o. male who presents with feeling good right now. Sometimes feels depressed. Sometimes he wishes he could do better. in reviewing what patient, he explains that he wants to make more friends. He has spent some time with his brother and he was encouraged that he can made his friends to help build relationships. Therapist also encouraged him to look at making friends in his different activities because it well increase his chances of meeting people. He related sometimes he feels good and sometimes he thinks he is not worthy of getting a girlfriend. He doesn't pick up cues that they are interested. Reviewed video on nonverbal communication to help patient be better at picking up cues and better at communicating himself. Identified the patient has self-esteem but that working on improving self-esteem would be helpful for focus of treatment. Reviewed worksheet "The basics of human worth" to help patient understand unconditional worth and not base it on externals. Discussed ways patient can work on self-esteem and he identified losing weight. Therapist suggested giving himself or one thing that is positive. Reviewed video "why do we lose control over emotions" to help patient understand the brain better to help in managing anger and overwhelming emotions.  self-esteem as a focus of treatment  Things t discussed o do to lose weight. Say something positive to  himself.   Suicidal/Homicidal: No  Therapist Response: Reviewed symptoms and identified the patient does not pick up cues that girls are interested so reviewed a video that described types of nonverbal communication to help improve patient's social skills. Identified source of depression as not having friends so reviewed strategies where he could meet people. Identified self-esteem issues so reviewed handout with patient "the basics of human worth" discussed that everybody has unconditional self-worth. Reviewed video "why do we lose control of her emotions" to help patient learn more about how the brain works in order for him to develop skills in anger management and emotional regulation. Provided supportive and strength-based interventions  Plan: Return again in 1 weeks.2.patient give himself credit for one positive thing a day.3.patient continued to gain insight and practice social skills.4.patient learn and implement emotional regulation skills   Diagnosis: Axis I:  MDD, recurrent, severe, Generalized Anxiety Disorder    Axis II: No diagnosis    Culver Feighner A, LCSW 04/22/2016

## 2016-04-29 ENCOUNTER — Ambulatory Visit (INDEPENDENT_AMBULATORY_CARE_PROVIDER_SITE_OTHER): Payer: BLUE CROSS/BLUE SHIELD | Admitting: Licensed Clinical Social Worker

## 2016-04-29 DIAGNOSIS — F411 Generalized anxiety disorder: Secondary | ICD-10-CM

## 2016-04-29 DIAGNOSIS — F332 Major depressive disorder, recurrent severe without psychotic features: Secondary | ICD-10-CM

## 2016-04-29 NOTE — Progress Notes (Signed)
   THERAPIST PROGRESS NOTE  Session Time: 2 PM to 2:55 PM  Participation Level: Active  Behavioral Response: CasualAlertEuthymic and Irritable  Type of Therapy: Individual Therapy  Treatment Goals addressed:  Anger, Anxiety, Coping and Diagnosis: MDD, recurrent, severe, Generalized Anxiety Disorder, cope with anxiety and depression as it causes him stress  Interventions: CBT, Strength-based and Supportive  Summary: John [name] is a 25 y.o. male who presents with something going through his head for past few days. He doesn't believe in true love. It gets him mad  because he believes it is something that people make up and not true. Therapist talked to patient about how love is often presented in an idealized way in movies and that in reality people love each other but it requires work and effort. Patient also shared that he had plans with his friend who canceled plans because he had other plans with another friend. Patient now thinks that he had made up a story that was not true and canceling plans. Therapist introduced worksheet "negative self talk" to help him challenge this negative thoughts and looking at evidence that does not support the thought. Talked in alternative thought that was more realistic and positive. Patient had trouble believing in alternative thoughts and therapist related that it takes time and practice to change habits of thinking. Patient identified being to conclusions and thinking the worst as one of his thought distortions and willing to work on it. Patient relates work is going really well and trying to connect with people socially. at, work is going well, trying to connect with people socially,    Suicidal/Homicidal: No  Therapist Response: Reviewed symptoms and progress of patient. Help patient process his feelings of anger. Completed "negative thought stopping" worksheet and used CBT techniques to help patient identify trigger, negative thought, develop an  alternative thought that is more realistic and positive. CBT strategies will help patient change self talk to thoughts are more positive  Identified and educated patient on thought distortions in describing jumping to conclusions and all or nothing thinking. Discussed healthy relationship skills related to setting boundaries. Discussed that setting boundaries is a healthy relationship skill but patient realizes that in trusting people, he does jump to conclusions and makes negative predictions about the future. Discussed CBT strategy takes time and practice and the patient is retraining has brain to develop healthier habits to help with his emotions. Provided supportive and strength-based interventions. Help patient process his emotions around anger.  Plan: Return again in 1 weeks.2. Patient views technique of challenging negative thoughts and identifying thought distortions to help with managing his emotions.   Diagnosis: Axis I:  generalized anxiety disorder, severe episode of recurrent major depressive disorder, without psychotic features    Axis II: No diagnosis    Jovan Colligan A, LCSW 04/29/2016

## 2016-05-06 ENCOUNTER — Telehealth: Payer: Self-pay | Admitting: Family Medicine

## 2016-05-06 NOTE — Telephone Encounter (Signed)
Ok for referral to continue psychology therapy as requested below.

## 2016-05-06 NOTE — Telephone Encounter (Signed)
[name]'s therapist  at  [facility] called needing a new referral for John [name] to have additional therapy.    He has had 16 sessions and she feels he needs continue but in order for insurance to pay he needs a doctors recommendation or order.  Contact number is [phone]  Thanks

## 2016-05-06 NOTE — Telephone Encounter (Signed)
Please review. KW 

## 2016-05-07 NOTE — Telephone Encounter (Signed)
[facility] [place] [facility] who states no Berkley Harveyauth is needed for pt to be seen by mental health.Referance # [id number].I also spoke with Denny PeonKim M. at [facility] who states there is no visit limit,no authorization  Needed for mental health.Referance # [id number].Pt advised

## 2016-06-02 ENCOUNTER — Telehealth: Payer: Self-pay | Admitting: Licensed Clinical Social Worker

## 2016-06-02 NOTE — Telephone Encounter (Signed)
Therapist discussed with mom that therapist called Medicaid for authorization for more services and was [facility]. Mom related that therapist needs to get authorization for co-pay for [facility]. She is calling tomorrow to give therapist right [facility] number for authorization for co-pay.

## 2016-06-14 ENCOUNTER — Telehealth (HOSPITAL_COMMUNITY): Payer: Self-pay | Admitting: Licensed Clinical Social Worker

## 2016-06-14 NOTE — Telephone Encounter (Signed)
06/11/16  Therapist contacted Medicaid provider services line once had received clarification for patient's mom that authorization was being sought for secondary insurance for co-pay. Called [phone]. Representative was unfamiliar with therapist request and referred therapists to financial services and claims at [phone]. Representative not sure about the request and gave  email address for utilization review. Therapist unable to send email even after reviewed correct email address with representative from insurance. (email address: [email or web address].) Therapist given number for access by our UR department at [phone] and talked to representatives Patience. She was unsure with what the issue was and did not think there was a problem as claims had been submitted for services and paid so far. She gave therapist contact name and number for person who would be able to provide more information and know more about necessity of pre certifying for secondary co-pay. Therapist emailed [email or web address] as well as leaving a message at her phone number [phone]

## 2016-06-15 ENCOUNTER — Telehealth (HOSPITAL_COMMUNITY): Payer: Self-pay | Admitting: Licensed Clinical Social Worker

## 2016-06-15 NOTE — Telephone Encounter (Signed)
Therapist contacted patient's mother, John [name], and related information she found from research of contacting financial, access and [employer]. Related that therapist was contacted by Garnet KoyanagiMalinda Lyerly from [employer] through email related that she asked around and it is her  understanding in [employer] that therapist didn't need to do anything. The only thing she could think of is if he has behavioral health benefits under [employer], you would need to bill them first for a denial before you bill Medicaid.  As long as the member has unmanaged visits left, they typically don't authorize visits. He has 14 remaining. It looks like previous OPT claims have gone through ok. Therapist gave mom number for access at [phone] for her to look into as well.

## 2016-06-25 ENCOUNTER — Encounter: Payer: Self-pay | Admitting: Family Medicine

## 2016-06-25 ENCOUNTER — Ambulatory Visit (INDEPENDENT_AMBULATORY_CARE_PROVIDER_SITE_OTHER): Payer: BLUE CROSS/BLUE SHIELD | Admitting: Family Medicine

## 2016-06-25 VITALS — BP 124/70 | HR 84 | Temp 98.1°F | Resp 16 | Ht 68.0 in | Wt 209.0 lb

## 2016-06-25 DIAGNOSIS — B9789 Other viral agents as the cause of diseases classified elsewhere: Secondary | ICD-10-CM

## 2016-06-25 DIAGNOSIS — J069 Acute upper respiratory infection, unspecified: Secondary | ICD-10-CM

## 2016-06-25 NOTE — Progress Notes (Signed)
Patient: John [name] Male    DOB: 11/23/1990   25 y.o.   MRN: [id number] Visit Date: 06/25/2016  Today's Provider: Mila Merryonald Fisher, MD   Chief Complaint  Patient presents with  . Cough   Subjective:    Cough started yesterday with dry throat and runny nose. Patient stated that symptoms have gradually worsened. He has taken otc ibuprofen and Oscillococcinum with mild relief.    Cough  This is a new problem. The current episode started yesterday. The problem has been gradually worsening. The problem occurs every few minutes. The cough is productive of sputum. Associated symptoms include nasal congestion, postnasal drip, rhinorrhea and a sore throat (dry throat). Pertinent negatives include no chest pain, chills, ear congestion, ear pain, fever, headaches, heartburn, hemoptysis, myalgias, rash, shortness of breath, sweats, weight loss or wheezing. Treatments tried: ibuprofen and oscillococcinum. The treatment provided mild relief. There is no history of asthma, bronchiectasis, bronchitis, COPD, emphysema, environmental allergies or pneumonia.       Allergies  Allergen Reactions  . Cefzil [Cefprozil] Rash  . Lorabid [Loracarbef] Rash     Current Outpatient Prescriptions:  .  albuterol-ipratropium (COMBIVENT) 18-103 MCG/ACT inhaler, Inhale 2 puffs into the lungs every 4 (four) hours., Disp: , Rfl:   Review of Systems  Constitutional: Negative for appetite change, chills, fever and weight loss.  HENT: Positive for congestion, postnasal drip, rhinorrhea, sneezing, sore throat (dry throat) and voice change. Negative for ear pain, sinus pain and sinus pressure.   Respiratory: Positive for cough. Negative for hemoptysis, chest tightness, shortness of breath and wheezing.   Cardiovascular: Negative for chest pain and palpitations.  Gastrointestinal: Negative for abdominal pain, heartburn, nausea and vomiting.  Musculoskeletal: Negative for myalgias.  Skin: Negative for  rash.  Allergic/Immunologic: Negative for environmental allergies.  Neurological: Negative for headaches.    Social History  Substance Use Topics  . Smoking status: Never Smoker  . Smokeless tobacco: Never Used  . Alcohol use No     Comment: rarely   Objective:   BP 124/70 (BP Location: Right Arm, Patient Position: Sitting, Cuff Size: Large)   Pulse 84   Temp 98.1 F (36.7 C) (Oral)   Resp 16   Ht 5\' 8"  (1.727 m)   Wt 209 lb (94.8 kg)   SpO2 97%   BMI 31.78 kg/m   Physical Exam  General Appearance:    Alert, cooperative, no distress  HENT:   bilateral TM normal without fluid or infection, neck without nodes, pharynx erythematous without exudate, sinuses nontender, post nasal drip noted, nasal mucosa congested and nasal mucosa pale and congested  Eyes:    PERRL, conjunctiva/corneas clear, EOM's intact       Lungs:     Clear to auscultation bilaterally, respirations unlabored  Heart:    Regular rate and rhythm  Neurologic:   Awake, alert, oriented x 3. No apparent focal neurological           defect.           Assessment & Plan:     1. Viral upper respiratory tract infection Counseled regarding signs and symptoms of viral and bacterial respiratory infections. Advised to call or return for additional evaluation if he develops any sign of bacterial infection, or if current symptoms last longer than 10 days.        The entirety of the information documented in the History of Present Illness, Review of Systems and Physical Exam were personally obtained by  me. Portions of this information were initially documented by April M. Hyacinth MeekerMiller, CMA and reviewed by me for thoroughness and accuracy.    Mila Merryonald Fisher, MD  [facility]

## 2016-06-25 NOTE — Patient Instructions (Signed)
You can take OTC Mucinex (guaifenesin) for chest or sinus congestion and to help your cough    Upper Respiratory Infection, Adult Most upper respiratory infections (URIs) are a viral infection of the air passages leading to the lungs. A URI affects the nose, throat, and upper air passages. The most common type of URI is nasopharyngitis and is typically referred to as "the common cold." URIs run their course and usually go away on their own. Most of the time, a URI does not require medical attention, but sometimes a bacterial infection in the upper airways can follow a viral infection. This is called a secondary infection. Sinus and middle ear infections are common types of secondary upper respiratory infections. Bacterial pneumonia can also complicate a URI. A URI can worsen asthma and chronic obstructive pulmonary disease (COPD). Sometimes, these complications can require emergency medical care and may be life threatening. What are the causes? Almost all URIs are caused by viruses. A virus is a type of germ and can spread from one person to another. What increases the risk? You may be at risk for a URI if:  You smoke.  You have chronic heart or lung disease.  You have a weakened defense (immune) system.  You are very young or very old.  You have nasal allergies or asthma.  You work in crowded or poorly ventilated areas.  You work in health care facilities or schools. What are the signs or symptoms? Symptoms typically develop 2-3 days after you come in contact with a cold virus. Most viral URIs last 7-10 days. However, viral URIs from the influenza virus (flu virus) can last 14-18 days and are typically more severe. Symptoms may include:  Runny or stuffy (congested) nose.  Sneezing.  Cough.  Sore throat.  Headache.  Fatigue.  Fever.  Loss of appetite.  Pain in your forehead, behind your eyes, and over your cheekbones (sinus pain).  Muscle aches. How is this  diagnosed? Your health care provider may diagnose a URI by:  Physical exam.  Tests to check that your symptoms are not due to another condition such as:  Strep throat.  Sinusitis.  Pneumonia.  Asthma. How is this treated? A URI goes away on its own with time. It cannot be cured with medicines, but medicines may be prescribed or recommended to relieve symptoms. Medicines may help:  Reduce your fever.  Reduce your cough.  Relieve nasal congestion. Follow these instructions at home:  Take medicines only as directed by your health care provider.  Gargle warm saltwater or take cough drops to comfort your throat as directed by your health care provider.  Use a warm mist humidifier or inhale steam from a shower to increase air moisture. This may make it easier to breathe.  Drink enough fluid to keep your urine clear or pale yellow.  Eat soups and other clear broths and maintain good nutrition.  Rest as needed.  Return to work when your temperature has returned to normal or as your health care provider advises. You may need to stay home longer to avoid infecting others. You can also use a face mask and careful hand washing to prevent spread of the virus.  Increase the usage of your inhaler if you have asthma.  Do not use any tobacco products, including cigarettes, chewing tobacco, or electronic cigarettes. If you need help quitting, ask your health care provider. How is this prevented? The best way to protect yourself from getting a cold is to practice  good hygiene.  Avoid oral or hand contact with people with cold symptoms.  Wash your hands often if contact occurs. There is no clear evidence that vitamin C, vitamin E, echinacea, or exercise reduces the chance of developing a cold. However, it is always recommended to get plenty of rest, exercise, and practice good nutrition. Contact a health care provider if:  You are getting worse rather than better.  Your symptoms are  not controlled by medicine.  You have chills.  You have worsening shortness of breath.  You have brown or red mucus.  You have yellow or brown nasal discharge.  You have pain in your face, especially when you bend forward.  You have a fever.  You have swollen neck glands.  You have pain while swallowing.  You have white areas in the back of your throat. Get help right away if:  You have severe or persistent:  Headache.  Ear pain.  Sinus pain.  Chest pain.  You have chronic lung disease and any of the following:  Wheezing.  Prolonged cough.  Coughing up blood.  A change in your usual mucus.  You have a stiff neck.  You have changes in your:  Vision.  Hearing.  Thinking.  Mood. This information is not intended to replace advice given to you by your health care provider. Make sure you discuss any questions you have with your health care provider. Document Released: 12/15/2000 Document Revised: 02/22/2016 Document Reviewed: 09/26/2013 Elsevier Interactive Patient Education  2017 [employer].

## 2016-07-08 ENCOUNTER — Ambulatory Visit: Payer: Self-pay | Admitting: Licensed Clinical Social Worker

## 2016-07-22 ENCOUNTER — Ambulatory Visit: Payer: BLUE CROSS/BLUE SHIELD | Admitting: Licensed Clinical Social Worker

## 2016-08-03 ENCOUNTER — Encounter: Payer: Self-pay | Admitting: Family Medicine

## 2016-08-03 ENCOUNTER — Ambulatory Visit (INDEPENDENT_AMBULATORY_CARE_PROVIDER_SITE_OTHER): Payer: BLUE CROSS/BLUE SHIELD | Admitting: Family Medicine

## 2016-08-03 VITALS — BP 110/68 | HR 97 | Temp 97.7°F | Resp 18 | Wt 208.0 lb

## 2016-08-03 DIAGNOSIS — A084 Viral intestinal infection, unspecified: Secondary | ICD-10-CM

## 2016-08-03 MED ORDER — PROMETHAZINE HCL 25 MG PO TABS: 25.0000 mg | ORAL_TABLET | Freq: Three times a day (TID) | ORAL | 0 refills | Status: DC | PRN

## 2016-08-03 NOTE — Progress Notes (Signed)
       Patient: John DaftChristopher D Zhan Male    DOB: 11/20/1990   25 y.o.   MRN: [id number] Visit Date: 08/03/2016  Today's Provider: Mila Merryonald Twan Harkin, MD   Chief Complaint  Patient presents with  . Vomiting   Subjective:    HPI Vomiting and Diarrhea:  Patient comes in today reporting that since yesterday morning he has had several episodes of Diarrhea and vomiting. Patient has also had abdominal pain, cough, chills and nausea. Patient states he felt like he had a fever but didn't have a thermometer to check.     Allergies  Allergen Reactions  . Cefzil [Cefprozil] Rash  . Lorabid [Loracarbef] Rash     Current Outpatient Prescriptions:  .  albuterol-ipratropium (COMBIVENT) 18-103 MCG/ACT inhaler, Inhale 2 puffs into the lungs every 4 (four) hours., Disp: , Rfl:   Review of Systems  Constitutional: Positive for appetite change. Negative for chills and fever.  HENT: Negative for mouth sores, nosebleeds, postnasal drip, rhinorrhea, sinus pain, sinus pressure, sneezing and sore throat.   Respiratory: Positive for cough (dry). Negative for chest tightness, shortness of breath and wheezing.   Cardiovascular: Negative for chest pain and palpitations.  Gastrointestinal: Positive for abdominal pain, diarrhea and nausea. Negative for anal bleeding and vomiting.    Social History  Substance Use Topics  . Smoking status: Never Smoker  . Smokeless tobacco: Never Used  . Alcohol use Yes     Comment: rarely   Objective:   BP 110/68 (BP Location: Left Arm, Patient Position: Sitting, Cuff Size: Large)   Pulse 97   Temp 97.7 F (36.5 C) (Oral)   Resp 18   Wt 208 lb (94.3 kg)   SpO2 96% Comment: room air  BMI 31.63 kg/m   Physical Exam  General Appearance:    Alert, cooperative, no distress  Eyes:    PERRL, conjunctiva/corneas clear, EOM's intact       Lungs:     Clear to auscultation bilaterally, respirations unlabored  Heart:    Regular rate and rhythm  Abdomen:   soft or round.  No CVA tenderness. Mild generalized tenderness. No rebound, no guarding.         Assessment & Plan:     1. Gastroenteritis and colitis, viral Improving today. Counseled on prudent diet, push clear fluids. Call if not steadily improving.  - promethazine (PHENERGAN) 25 MG tablet; Take 1 tablet (25 mg total) by mouth every 8 (eight) hours as needed for nausea or vomiting.  Dispense: 10 tablet; Refill: 0     The entirety of the information documented in the History of Present Illness, Review of Systems and Physical Exam were personally obtained by me. Portions of this information were initially documented by Awilda Billoshena Chambers, CMA and reviewed by me for thoroughness and accuracy.    Mila Merryonald Akelia Husted, MD  [facility]

## 2016-08-03 NOTE — Patient Instructions (Addendum)
Viral Gastroenteritis, Adult Introduction Viral gastroenteritis is also known as the stomach flu. This condition is caused by certain germs (viruses). These germs can be passed from person to person very easily (are very contagious). This condition can cause sudden watery poop (diarrhea), fever, and throwing up (vomiting). Having watery poop and throwing up can make you feel weak and cause you to get dehydrated. Dehydration can make you tired and thirsty, make you have a dry mouth, and make it so you pee (urinate) less often. Older adults and people with other diseases or a weak defense system (immune system) are at higher risk for dehydration. It is important to replace the fluids that you lose from having watery poop and throwing up. Follow these instructions at home: Follow instructions from your doctor about how to care for yourself at home. Eating and drinking Follow these instructions as told by your doctor:  Take an oral rehydration solution (ORS). This is a drink that is sold at pharmacies and stores.  Drink clear fluids in small amounts as you are able, such as:  Water.  Ice chips.  Diluted fruit juice.  Low-calorie sports drinks.  Eat bland, easy-to-digest foods in small amounts as you are able, such as:  Bananas.  Applesauce.  Rice.  Low-fat (lean) meats.  Toast.  Crackers.  Avoid fluids that have a lot of sugar or caffeine in them.  Avoid alcohol.  Avoid spicy or fatty foods. General instructions  Drink enough fluid to keep your pee (urine) clear or pale yellow.  Wash your hands often. If you cannot use soap and water, use hand sanitizer.  Make sure that all people in your home wash their hands well and often.  Rest at home while you get better.  Take over-the-counter and prescription medicines only as told by your doctor.  Watch your condition for any changes.  Take a warm bath to help with any burning or pain from having watery poop.  Keep all  follow-up visits as told by your doctor. This is important. Contact a doctor if:  You cannot keep fluids down.  Your symptoms get worse.  You have new symptoms.  You feel light-headed or dizzy.  You have muscle cramps. Get help right away if:  You have chest pain.  You feel very weak or you pass out (faint).  You see blood in your throw-up.  Your throw-up looks like coffee grounds.  You have bloody or black poop (stools) or poop that look like tar.  You have a very bad headache, a stiff neck, or both.  You have a rash.  You have very bad pain, cramping, or bloating in your belly (abdomen).  You have trouble breathing.  You are breathing very quickly.  Your heart is beating very quickly.  Your skin feels cold and clammy.  You feel confused.  You have pain when you pee.  You have signs of dehydration, such as:  Dark pee, hardly any pee, or no pee.  Cracked lips.  Dry mouth.  Sunken eyes.  Sleepiness.  Weakness. This information is not intended to replace advice given to you by your health care provider. Make sure you discuss any questions you have with your health care provider. Document Released: 12/08/2007 Document Revised: 01/09/2016 Document Reviewed: 02/25/2015  2017 Elsevier  

## 2016-08-05 ENCOUNTER — Ambulatory Visit (INDEPENDENT_AMBULATORY_CARE_PROVIDER_SITE_OTHER): Payer: BLUE CROSS/BLUE SHIELD | Admitting: Licensed Clinical Social Worker

## 2016-08-05 DIAGNOSIS — F332 Major depressive disorder, recurrent severe without psychotic features: Secondary | ICD-10-CM

## 2016-08-05 DIAGNOSIS — F411 Generalized anxiety disorder: Secondary | ICD-10-CM | POA: Diagnosis not present
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
## 2016-08-19 ENCOUNTER — Ambulatory Visit (INDEPENDENT_AMBULATORY_CARE_PROVIDER_SITE_OTHER): Payer: BLUE CROSS/BLUE SHIELD | Admitting: Licensed Clinical Social Worker

## 2016-08-19 DIAGNOSIS — F411 Generalized anxiety disorder: Secondary | ICD-10-CM | POA: Diagnosis not present

## 2016-08-19 DIAGNOSIS — F332 Major depressive disorder, recurrent severe without psychotic features: Secondary | ICD-10-CM

## 2016-08-19 NOTE — Progress Notes (Signed)
   THERAPIST PROGRESS NOTE  Session Time: 4:10 PM to 5:05 PM  Participation Level: Active  Behavioral Response: CasualAlertEuthymic  Type of Therapy: Individual Therapy  Treatment Goals addressed:  work on coping of anxiety and anger, improved management of anger and work on communication and emotional regulation to help him in managing situations where he gets agitated  Interventions: CBT, Solution Focused, Strength-based, Supportive and Other: Skills to build self-esteem  Summary: John [name] is a 26 y.o. male who presents with mood being 7 out of 10. He described seeing couple's and it is hard for him to see them. He shared that it was that the point where he felt like he could cry. Patient said he stayed up last night and wrote and feels this will be helpful way to help him express how he is feeling. He plans on doing this on a regular basis to share in therapy. Explored patient's feelings about relationships and he is concerned of the chance that they can turn out badly that it causes him to hesitate about being in relationship. Not sure if he believes in love. He thinks he may be the guy that does not have the ability to have girls like him. Challenged patient on proof of this. Patient shared he may have developed this idea because he's not happy with self. Discussed taking risks, that it is a part of life and that is open ourselves to  experiencing life to the fullest. Explained that we have to give herself a chance to learn and grow from new experiences and that everybody goes through the experience of doing something for the first time. Shared Ted talk "How to experience emotional first aid" by Kelton PillarGuy Winch. Patient reviewed session and said that he earned that if he puts himself out there that he eventually will "make it"   Suicidal/Homicidal: No  Therapist Response: Reviewed patient's progress and symptoms. Help patient explore his feelings around relationships and helping him to  gain healthier insight to relationships and help him and his own decision making about relationships. Utilize motivational interviewing to look at pros and cons of holding himself back to protect himself versus allowing himself to have new experiences of dating and recognizing he is her experiences he can enjoy. Worked on skills to build self-esteem. He presented [name] talk by [name] "Why we all need to practice emotional first aid" in order to help patient challenge negative thoughts about self. Provided strength based and supportive interventions.    Plan: Return again in 2 weeks.2. Patient will start writing in journal to help him and coping strategies for emotions.3. Therapist work with patient on self-esteem building skills  Diagnosis: Axis I:   generalized anxiety disorder, severe episode of recurrent major depressive disorder, without psychotic features    Axis II: No diagnosis    [name] A, LCSW 08/19/2016

## 2016-09-02 ENCOUNTER — Ambulatory Visit (INDEPENDENT_AMBULATORY_CARE_PROVIDER_SITE_OTHER): Payer: BLUE CROSS/BLUE SHIELD | Admitting: Licensed Clinical Social Worker

## 2016-09-02 DIAGNOSIS — F411 Generalized anxiety disorder: Secondary | ICD-10-CM | POA: Diagnosis not present

## 2016-09-02 DIAGNOSIS — F332 Major depressive disorder, recurrent severe without psychotic features: Secondary | ICD-10-CM
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
## 2016-09-16 ENCOUNTER — Ambulatory Visit (INDEPENDENT_AMBULATORY_CARE_PROVIDER_SITE_OTHER): Payer: BLUE CROSS/BLUE SHIELD | Admitting: Licensed Clinical Social Worker

## 2016-09-16 DIAGNOSIS — F411 Generalized anxiety disorder: Secondary | ICD-10-CM | POA: Diagnosis not present

## 2016-09-16 DIAGNOSIS — F332 Major depressive disorder, recurrent severe without psychotic features: Secondary | ICD-10-CM
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
## 2016-09-30 ENCOUNTER — Ambulatory Visit (INDEPENDENT_AMBULATORY_CARE_PROVIDER_SITE_OTHER): Payer: BLUE CROSS/BLUE SHIELD | Admitting: Licensed Clinical Social Worker

## 2016-09-30 DIAGNOSIS — F332 Major depressive disorder, recurrent severe without psychotic features: Secondary | ICD-10-CM | POA: Diagnosis not present

## 2016-09-30 DIAGNOSIS — F411 Generalized anxiety disorder: Secondary | ICD-10-CM

## 2016-09-30 NOTE — Progress Notes (Signed)
   THERAPIST PROGRESS NOTE  Session Time: 2:00 PM to 2:50 PM  Participation Level: Active  Behavioral Response: CasualAlertEuthymic  Type of Therapy: Individual Therapy  Treatment Goals addressed: work on coping of anxiety and anger, improved management of anger and work on communication and emotional regulation to help him in managing situations where he gets agitated  Interventions: CBT, Solution Focused, Strength-based, Supportive and Social Skills Training  Summary: John [name] is a 26 y.o. male who presents with follow up from last session of taking steps in developing relationship with a girl. They are texting daily. He relates that the friendship is good for now and just will see where it goes. Discussed with therapist that a healthy approach in developing a relationship is to get to know a person first,develop a good friendship that will provide a strong foundation for a relationship. He realizes he worries about different things and realizes that he always thinks of thinks of the worst case scenario and can't get his mind off of it. Therapist asked what advice he would give a friend and patient related he would say "to stay positive". Therapist worked with patient on ways he could challenge thought distortion. Reviiewed video "Dating advice for Adults with Autism" and patient relates he could work on opening up more and not being so hesitant. Therapist encouraged as well that elaborating in converstation is also an effective social skill. Reviewed worksheet "An attitude of Gratitude" to discuss helpfulness of positive attitude. Patient reviewed symptoms in genera and he relates that anger has not been an issue and has beend doing pretty good and notices that progress since he started treatment.   Suicidal/Homicidal: No  Therapist Response: Reviewed patient's progress and symptoms and note patient's continued progress. Challenged patient on through distortion of frequently  thinking the worst will happen. Encouraged patient to challenge distortion but asking questions how likely is the worst to happen and what is most likely to happen. worked on developing insight that cognitive distortion is holding him back from experiences he would enjoy. Watched video "Dating advice for adults with autism" and review useful social skills and skills that patient needs to work on. Provided positive feedback for patient taking positive steps in socializing and that building the relationship and getting to know a person before starting to date was a healthy approach to a relationship. Reinforced patient's insight of having a positive attitude but how it helps one to feel better but also can change our life for the better. Provided strength based and supportive interventions.   Plan: Return again in 3 weeks.2.Therapist work with patient on coping skills for anxiety.3.Patient continue to learn and apply social skills to his relationships.   Diagnosis: Axis I: Generalized Anxiety Disorder, Major Depressive Disorder, recurrent, severe    Axis II: No diagnosis    Ketara Cavness A, LCSW 09/30/2016

## 2016-10-21 ENCOUNTER — Ambulatory Visit: Payer: BLUE CROSS/BLUE SHIELD | Admitting: Licensed Clinical Social Worker

## 2016-12-02 ENCOUNTER — Ambulatory Visit: Payer: BLUE CROSS/BLUE SHIELD | Admitting: Licensed Clinical Social Worker

## 2016-12-23 ENCOUNTER — Ambulatory Visit: Payer: BLUE CROSS/BLUE SHIELD | Admitting: Licensed Clinical Social Worker

## 2017-01-26 ENCOUNTER — Telehealth (HOSPITAL_COMMUNITY): Payer: Self-pay | Admitting: Licensed Clinical Social Worker

## 2017-01-27 NOTE — Telephone Encounter (Signed)
Therapist returned call from patient and mom and touch base with mom about referral to other treatment providers since therapist has left [place] office. Discussed resources that included providers who specialize in autism in [place] area and to local general providers in [place] area. Discussed with mom calling her insurance who would help her find a local provider to include both [employer] and Medicaid provider for that area
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
## 2019-05-03 ENCOUNTER — Encounter: Payer: Self-pay | Admitting: Physician Assistant

## 2019-05-03 ENCOUNTER — Ambulatory Visit (INDEPENDENT_AMBULATORY_CARE_PROVIDER_SITE_OTHER): Payer: Self-pay | Admitting: Physician Assistant

## 2019-05-03 ENCOUNTER — Other Ambulatory Visit: Payer: Self-pay

## 2019-05-03 VITALS — BP 135/78 | HR 77 | Temp 97.1°F | Resp 16 | Wt 208.0 lb

## 2019-05-03 DIAGNOSIS — F411 Generalized anxiety disorder: Secondary | ICD-10-CM

## 2019-05-03 DIAGNOSIS — F39 Unspecified mood [affective] disorder: Secondary | ICD-10-CM

## 2019-05-03 DIAGNOSIS — F332 Major depressive disorder, recurrent severe without psychotic features: Secondary | ICD-10-CM

## 2019-05-03 MED ORDER — SERTRALINE HCL 50 MG PO TABS: 50.0000 mg | ORAL_TABLET | Freq: Every day | ORAL | 0 refills | Status: DC

## 2019-05-03 NOTE — Patient Instructions (Signed)
Depression Screening Depression screening is a tool that your health care provider can use to learn if you have symptoms of depression. Depression is a common condition with many symptoms that are also often found in other conditions. Depression is treatable, but it must first be diagnosed. You may not know that certain feelings, thoughts, and behaviors that you are having can be symptoms of depression. Taking a depression screening test can help you and your health care provider decide if you need more assessment, or if you should be referred to a mental health care provider. What are the screening tests?  You may have a physical exam to see if another condition is affecting your mental health. You may have a blood or urine sample taken during the physical exam.  You may be interviewed using a screening tool that was developed from research, such as one of these: ? Patient Health Questionnaire (PHQ). This is a set of either 2 or 9 questions. A health care provider who has been trained to score this screening test uses a guide to assess if your symptoms suggest that you may have depression. ? Hamilton Depression Rating Scale (HAM-D). This is a set of either 17 or 24 questions. You may be asked to take it again during or after your treatment, to see if your depression has gotten better. ? Beck Depression Inventory (BDI). This is a set of 21 multiple choice questions. Your health care provider scores your answers to assess:  Your level of depression, ranging from mild to severe.  Your response to treatment.  Your health care provider may talk with you about your daily activities, such as eating, sleeping, work, and recreation, and ask if you have had any changes in activity.  Your health care provider may ask you to see a mental health specialist, such as a psychiatrist or psychologist, for more evaluation. Who should be screened for depression?   All adults, including adults with a family history  of a mental health disorder.  Adolescents who are 12-18 years old.  People who are recovering from a myocardial infarction (MI).  Pregnant women, or women who have given birth.  People who have a long-term (chronic) illness.  Anyone who has been diagnosed with another type of a mental health disorder.  Anyone who has symptoms that could show depression. What do my results mean? Your health care provider will review the results of your depression screening, physical exam, and lab tests. Positive screens suggest that you may have depression. Screening is the first step in getting the care that you may need. It is up to you to get your screening results. Ask your health care provider, or the department that is doing your screening tests, when your results will be ready. Talk with your health care provider about your results and diagnosis. A diagnosis of depression is made using the Diagnostic and Statistical Manual of Mental Disorders (DSM-V). This is a book that lists the number and type of symptoms that must be present for a health care provider to give a specific diagnosis.  Your health care provider may work with you to treat your symptoms of depression, or your health care provider may help you find a mental health provider who can assess, diagnose, and treat your depression. Get help right away if:  You have thoughts about hurting yourself or others. If you ever feel like you may hurt yourself or others, or have thoughts about taking your own life, get help right away. You   can go to your nearest emergency department or call:  Your local emergency services (911 in the [place]  A suicide crisis helpline, such as the National Suicide Prevention Lifeline at 1-800-273-8255. This is open 24 hours a day. Summary  Depression screening is the first step in getting the help that you may need.  If your screening test shows symptoms of depression (is positive), your health care provider may ask  you to see a mental health provider.  Anyone who is age 12 or older should be screened for depression. This information is not intended to replace advice given to you by your health care provider. Make sure you discuss any questions you have with your health care provider. Document Released: 11/05/2016 Document Revised: 06/03/2017 Document Reviewed: 11/05/2016 Elsevier Patient Education  2020 Elsevier Inc.  

## 2019-05-03 NOTE — Progress Notes (Signed)
Patient: John [name] Male    DOB: [birth year]   28 y.o.   MRN: [id number] Visit Date: 05/03/2019  Today's Provider: Trey Sailors, PA-C   Chief Complaint  Patient presents with  . Depression  . Anxiety   Subjective:     Depression        This is a chronic problem.  The current episode started more than 1 year ago.   The onset quality is sudden.   Associated symptoms include decreased concentration, helplessness, hopelessness, irritable, restlessness, decreased interest, myalgias and sad.  Associated symptoms include no fatigue, does not have insomnia, no appetite change, no body aches, no headaches, no indigestion and no suicidal ideas.     The symptoms are aggravated by work stress.  Past treatments include psychotherapy.  Reports worsening of anxiety and depression. He was previously seeing a therapist and counselor. Therapist is now in [place], [place]. [place]'t [place] interstate.  Reports increased insomnia. Reports mood is worse at work. He works at [employer]. Reports some increased irritability at work. Using CBD product from [employer] online delivery service. He would not like to lose his job. Normally he has a fear of medications, but is open to trying them. Recently started going back to the gym in order to improve his mood.   Declines to restart counseling today.     Office Visit from 10/20/2015 in [facility]  PHQ-9 Total Score  4       Allergies  Allergen Reactions  . Cefzil [Cefprozil] Rash  . Lorabid [Loracarbef] Rash     Current Outpatient Medications:  .  albuterol-ipratropium (COMBIVENT) 18-103 MCG/ACT inhaler, Inhale 2 puffs into the lungs every 4 (four) hours., Disp: , Rfl:  .  promethazine (PHENERGAN) 25 MG tablet, Take 1 tablet (25 mg total) by mouth every 8 (eight) hours as needed for nausea or vomiting., Disp: 10 tablet, Rfl: 0  Review of Systems  Constitutional: Negative.  Negative for appetite change and fatigue.    Respiratory: Negative.   Genitourinary: Negative.   Musculoskeletal: Positive for myalgias.  Neurological: Negative for headaches.  Psychiatric/Behavioral: Positive for decreased concentration and depression. Negative for suicidal ideas. The patient does not have insomnia.     Social History   Tobacco Use  . Smoking status: Never Smoker  . Smokeless tobacco: Never Used  Substance Use Topics  . Alcohol use: Yes    Comment: rarely      Objective:   BP 135/78   Pulse 77   Temp (!) 97.1 F (36.2 C) (Oral)   Resp 16   Wt 208 lb (94.3 kg)   BMI 31.63 kg/m  Vitals:   05/03/19 1046  BP: 135/78  Pulse: 77  Resp: 16  Temp: (!) 97.1 F (36.2 C)  TempSrc: Oral  Weight: 208 lb (94.3 kg)  Body mass index is 31.63 kg/m.   Physical Exam Constitutional:      General: He is irritable.     Appearance: Normal appearance.  Cardiovascular:     Rate and Rhythm: Normal rate.  Pulmonary:     Effort: Pulmonary effort is normal.  Skin:    General: Skin is warm and dry.  Neurological:     Mental Status: He is alert and oriented to person, place, and time. Mental status is at baseline.  Psychiatric:        Mood and Affect: Mood normal.        Behavior: Behavior normal.  No results found for any visits on 05/03/19.     Assessment & Plan    1. Severe episode of recurrent major depressive disorder, without psychotic features ([place])  We will start zoloft 50 mg QD and follow up in 6-8 weeks.   - sertraline (ZOLOFT) 50 MG tablet; Take 1 tablet (50 mg total) by mouth daily.  Dispense: 90 tablet; Refill: 0  2. Generalized anxiety disorder  - sertraline (ZOLOFT) 50 MG tablet; Take 1 tablet (50 mg total) by mouth daily.  Dispense: 90 tablet; Refill: 0  3. Mood disorder (HCC)  - sertraline (ZOLOFT) 50 MG tablet; Take 1 tablet (50 mg total) by mouth daily.  Dispense: 90 tablet; Refill: 0  The entirety of the information documented in the History of Present Illness, Review of  Systems and Physical Exam were personally obtained by me. Portions of this information were initially documented by Jennings Books, CMA and reviewed by me for thoroughness and accuracy.          Trinna Post, PA-C  [facility]

## 2019-06-07 ENCOUNTER — Ambulatory Visit (INDEPENDENT_AMBULATORY_CARE_PROVIDER_SITE_OTHER): Payer: Self-pay | Admitting: Physician Assistant

## 2019-06-07 DIAGNOSIS — F332 Major depressive disorder, recurrent severe without psychotic features: Secondary | ICD-10-CM

## 2019-06-07 DIAGNOSIS — F39 Unspecified mood [affective] disorder: Secondary | ICD-10-CM

## 2019-06-07 DIAGNOSIS — F411 Generalized anxiety disorder: Secondary | ICD-10-CM

## 2019-06-07 MED ORDER — SERTRALINE HCL 50 MG PO TABS: 50.0000 mg | ORAL_TABLET | Freq: Every day | ORAL | 1 refills | Status: DC

## 2019-06-07 NOTE — Progress Notes (Signed)
Patient: John [name] Male    DOB: 15-Jan-1991   28 y.o.   MRN: [id number] Visit Date: 06/07/2019  Today's Provider: Trey Sailors, PA-C   Chief Complaint  Patient presents with  . Anxiety   Subjective:    Virtual Visit via Telephone Note  I connected with John [name] on 06/07/19 at  2:40 PM EST by telephone and verified that I am speaking with the correct person using two identifiers.  Location: Patient: Home  Provider: Office   I discussed the limitations, risks, security and privacy concerns of performing an evaluation and management service by telephone and the availability of in person appointments. I also discussed with the patient that there may be a patient responsible charge related to this service. The patient expressed understanding and agreed to proceed.   HPI    Anxiety & Depression Patient presents today for anxiety and depression follow-up. Last office visit was on 05/03/2019. Patient was started on Sertraline 50 MG daily and reports good compliance with treatment. Patient states since starting the medication his anxiety has improved. Patient believes he has been helped. He reports he can tell a difference since taking a medication. Reports before starting medication he was "rough" but now he reports he is feeling better. Reports his family has noticed a difference. He reports his dad noticed a positive change. He reports his mother has also noticed positive changes. He has stopped the CBD supplement.   GAD 7 : Generalized Anxiety Score 06/07/2019  Nervous, Anxious, on Edge 1  Control/stop worrying 1  Worry too much - different things 1  Trouble relaxing 0  Restless 0  Easily annoyed or irritable 0  Afraid - awful might happen 0  Total GAD 7 Score 3  Anxiety Difficulty Not difficult at all   Patient reports he has one day of nasal congestion. He reports he has seasonal allergies and nasal congestion. He reports he has used nasacort in  the past which has helped him in the past. He denies fevers, chill, sore throat, nausea, vomiting, SOB, cough, contact with COVID positive.   Allergies  Allergen Reactions  . Cefzil [Cefprozil] Rash  . Lorabid [Loracarbef] Rash     Current Outpatient Medications:  .  albuterol-ipratropium (COMBIVENT) 18-103 MCG/ACT inhaler, Inhale 2 puffs into the lungs every 4 (four) hours., Disp: , Rfl:  .  sertraline (ZOLOFT) 50 MG tablet, Take 1 tablet (50 mg total) by mouth daily., Disp: 90 tablet, Rfl: 1 .  promethazine (PHENERGAN) 25 MG tablet, Take 1 tablet (25 mg total) by mouth every 8 (eight) hours as needed for nausea or vomiting., Disp: 10 tablet, Rfl: 0  Review of Systems  Social History   Tobacco Use  . Smoking status: Never Smoker  . Smokeless tobacco: Never Used  Substance Use Topics  . Alcohol use: Yes    Comment: rarely      Objective:   There were no vitals taken for this visit. There were no vitals filed for this visit.There is no height or weight on file to calculate BMI.   Physical Exam   No results found for any visits on 06/07/19.     Assessment & Plan     1. Generalized anxiety disorder  Patient reports a significant improvement in his anxiety and depression. He reports his family has noticed positive changes in him. He feels he is able to function better at work as well. Continue zoloft as well and follow up  once yearly, sooner if needed.   - sertraline (ZOLOFT) 50 MG tablet; Take 1 tablet (50 mg total) by mouth daily.  Dispense: 90 tablet; Refill: 1  2. Severe episode of recurrent major depressive disorder, without psychotic features ([place])  - sertraline (ZOLOFT) 50 MG tablet; Take 1 tablet (50 mg total) by mouth daily.  Dispense: 90 tablet; Refill: 1  3. Mood disorder (HCC)  - sertraline (ZOLOFT) 50 MG tablet; Take 1 tablet (50 mg total) by mouth daily.  Dispense: 90 tablet; Refill: 1  I discussed the assessment and treatment plan with the patient. The  patient was provided an opportunity to ask questions and all were answered. The patient agreed with the plan and demonstrated an understanding of the instructions.   The patient was advised to call back or seek an in-person evaluation if the symptoms worsen or if the condition fails to improve as anticipated.  I provided 15 minutes of non-face-to-face time during this encounter.     Trinna Post, PA-C  [facility]

## 2019-06-21 ENCOUNTER — Ambulatory Visit: Payer: Self-pay | Admitting: Physician Assistant

## 2019-11-06 ENCOUNTER — Telehealth: Payer: Self-pay | Admitting: Family Medicine

## 2019-11-06 NOTE — Telephone Encounter (Signed)
Patient's father Gala Romney states he needs a letter written by Dr. Sherrie Mustache stating patient has a disability so he can continue receiving insurance under his father's medical plan. Please call Doug when letter is ready for pick up. [phone]

## 2019-11-06 NOTE — Telephone Encounter (Signed)
   Patient's Father - Jamison Soward ( MRN: [id number]) called regarding needing some type of form stating patient has an autism disability to stay on his insurance.  Please call father - Gala Romney to let him know what he needs to do to get the form filled out for Stephan to stay on his insurance coverage.  Thanks, [employer]

## 2019-11-07 NOTE — Telephone Encounter (Signed)
Letter completed.

## 2019-11-07 NOTE — Telephone Encounter (Addendum)
Spoke with Gala Romney advised letter was placed up front and ready for pickup. Also advised office is open 8am-5pm and closed for lunch 11:45am-1:20pm. TNP

## 2020-01-30 ENCOUNTER — Telehealth: Payer: Self-pay | Admitting: *Deleted

## 2020-01-30 ENCOUNTER — Other Ambulatory Visit: Payer: Self-pay | Admitting: Physician Assistant

## 2020-01-30 DIAGNOSIS — F39 Unspecified mood [affective] disorder: Secondary | ICD-10-CM

## 2020-01-30 DIAGNOSIS — F411 Generalized anxiety disorder: Secondary | ICD-10-CM

## 2020-01-30 DIAGNOSIS — F332 Major depressive disorder, recurrent severe without psychotic features: Secondary | ICD-10-CM

## 2020-01-30 NOTE — Telephone Encounter (Signed)
I called and left a message for pt to call the office and make an appt to see Dr. Sherrie Mustache for a check up.   I gave him a 30 day courtesy supply of the Zoloft.

## 2020-02-01 ENCOUNTER — Other Ambulatory Visit: Payer: Self-pay

## 2020-02-01 ENCOUNTER — Ambulatory Visit (INDEPENDENT_AMBULATORY_CARE_PROVIDER_SITE_OTHER): Payer: Self-pay | Admitting: Family Medicine

## 2020-02-01 ENCOUNTER — Encounter: Payer: Self-pay | Admitting: Family Medicine

## 2020-02-01 VITALS — BP 135/84 | HR 80 | Temp 97.5°F | Ht 68.0 in | Wt 230.2 lb

## 2020-02-01 DIAGNOSIS — F411 Generalized anxiety disorder: Secondary | ICD-10-CM

## 2020-02-01 DIAGNOSIS — F39 Unspecified mood [affective] disorder: Secondary | ICD-10-CM

## 2020-02-01 DIAGNOSIS — F845 Asperger's syndrome: Secondary | ICD-10-CM

## 2020-02-01 MED ORDER — SERTRALINE HCL 50 MG PO TABS: 50.0000 mg | ORAL_TABLET | Freq: Every day | ORAL | 4 refills | Status: DC

## 2020-02-01 NOTE — Patient Instructions (Signed)
.   Please review the attached list of medications and notify my office if there are any errors.   . Please bring all of your medications to every appointment so we can make sure that our medication list is the same as yours.   

## 2020-02-01 NOTE — Progress Notes (Signed)
Established patient visit   Patient: John [name]   DOB: 1991/06/06   29 y.o. Male  MRN: [id number] Visit Date: 02/01/2020  Today's healthcare provider: Mila Merry, MD   Chief Complaint  Patient presents with  . Depression  . Anxiety   Velora Mediate as a scribe for Mila Merry, MD.,have documented all relevant documentation on the behalf of Mila Merry, MD,as directed by  Mila Merry, MD while in the presence of Mila Merry, MD.  Subjective    HPI  Depression, Follow-up  He  was last seen for this 06/07/2019  Changes made at last visit include no change continue Sertraline.   He reports good compliance with treatment. He is not having side effects.   He reports good tolerance of treatment. Current symptoms include: depressed mood, difficulty concentrating, feelings of worthlessness/guilt and helplessness He feels he is slightly improved, but still having episodes since last visit.  Depression screen [facility] 2/9 02/01/2020 10/20/2015  Decreased Interest 3 0  Down, Depressed, Hopeless 1 1  PHQ - 2 Score 4 1  Altered sleeping 0 0  Tired, decreased energy 2 1  Change in appetite 0 0  Feeling bad or failure about yourself  2 1  Trouble concentrating 0 0  Moving slowly or fidgety/restless 0 0  Suicidal thoughts 0 1  PHQ-9 Score 8 4  Difficult doing work/chores Not difficult at all Not difficult at all    ----------------------------------------------------------------------------------------- Anxiety, Follow-up  He was last seen for anxiety 06/07/2019  Changes made at last visit include no change.   He reports good compliance with treatment. He reports good tolerance of treatment. He is not having side effects.   He feels his anxiety is moderate and slifghtly improived but still having episodes since last visit.  Symptoms: No chest pain No difficulty concentrating  No dizziness Yes fatigue  No feelings of losing control No insomnia    Yes irritable No palpitations  No panic attacks No racing thoughts  No shortness of breath No sweating  No tremors/shakes    GAD-7 Results GAD-7 Generalized Anxiety Disorder Screening Tool 02/01/2020 06/07/2019  1. Feeling Nervous, Anxious, or on Edge 0 1  2. Not Being Able to Stop or Control Worrying 1 1  3. Worrying Too Much About Different Things 2 1  4. Trouble Relaxing 1 0  5. Being So Restless it's Hard To Sit Still 0 0  6. Becoming Easily Annoyed or Irritable 3 0  7. Feeling Afraid As If Something Awful Might Happen 1 0  Total GAD-7 Score 8 3  Difficulty At Work, Home, or Getting  Along With Others? 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   No new complaints today.      Medications: Outpatient Medications Prior to Visit  Medication Sig  . albuterol-ipratropium (COMBIVENT) 18-103 MCG/ACT inhaler Inhale 2 puffs into the lungs every 4 (four) hours as needed.   . [DISCONTINUED] sertraline (ZOLOFT) 50 MG tablet Take 1 tablet by mouth once daily   No facility-administered medications prior to visit.    Review of Systems  Constitutional: Negative.   Respiratory: Negative.   Cardiovascular: Negative.   Musculoskeletal: Negative.   Psychiatric/Behavioral: Positive for dysphoric mood.  The patient is nervous/anxious.      Objective    BP (!) 135/84 (BP Location: Right Arm, Patient Position: Sitting, Cuff Size: Large)   Pulse 80   Temp (!) 97.5 F (36.4 C) (Oral)   Ht 5\' 8"  (1.727 m)   Wt (!) 230 lb 3.2 oz (104.4 kg)   BMI 35.00 kg/m   Physical Exam   General: Appearance:    Overweight male in no acute distress  Eyes:    PERRL,  conjunctiva/corneas clear, EOM's intact       Lungs:     Clear to auscultation bilaterally, respirations unlabored  Heart:    Normal heart rate. Normal rhythm. No murmurs, rubs, or gallops.   MS:   All extremities are intact.   Neurologic:   Awake, alert, oriented x 3. No apparent focal neurological           defect.        No results found for any visits on 02/01/20.  Assessment & Plan     1. Generalized anxiety disorder   2. Mood disorder (HCC)  Doing well current dose of sertraline which is to be continued - sertraline (ZOLOFT) 50 MG tablet; Take 1 tablet (50 mg total) by mouth daily.  Dispense: 90 tablet; Refill: 4  3. Asperger syndrome Stable, continues to work part time at [employer] where he has worked for several yearl.    Return in about 1 year (around 01/31/2021).      The entirety of the information documented in the History of Present Illness, Review of Systems and Physical Exam were personally obtained by me. Portions of this information were initially documented by the CMA and reviewed by me for thoroughness and accuracy.      02/02/2021, MD  [facility] [phone] (phone) [phone] (fax)  [facility]

## 2020-06-16 DIAGNOSIS — Z029 Encounter for administrative examinations, unspecified: Secondary | ICD-10-CM

## 2021-03-26 ENCOUNTER — Other Ambulatory Visit: Payer: Self-pay

## 2021-03-26 ENCOUNTER — Encounter: Payer: Self-pay | Admitting: Family Medicine

## 2021-03-26 ENCOUNTER — Ambulatory Visit (INDEPENDENT_AMBULATORY_CARE_PROVIDER_SITE_OTHER): Payer: BC Managed Care – PPO | Admitting: Family Medicine

## 2021-03-26 VITALS — BP 126/81 | HR 87 | Temp 97.7°F | Resp 16 | Ht 68.0 in | Wt 249.0 lb

## 2021-03-26 DIAGNOSIS — F411 Generalized anxiety disorder: Secondary | ICD-10-CM

## 2021-03-26 DIAGNOSIS — F840 Autistic disorder: Secondary | ICD-10-CM | POA: Diagnosis not present

## 2021-03-26 DIAGNOSIS — R002 Palpitations: Secondary | ICD-10-CM

## 2021-03-26 MED ORDER — SERTRALINE HCL 100 MG PO TABS: 100.0000 mg | ORAL_TABLET | Freq: Every day | ORAL | 0 refills | Status: DC

## 2021-03-26 NOTE — Progress Notes (Signed)
I,April Miller,acting as a scribe for Megan Mans, MD.,have documented all relevant documentation on the behalf of Megan Mans, MD,as directed by  Megan Mans, MD while in the presence of Megan Mans, MD.   Established patient visit   Patient: John [name]   DOB: 02/05/1991   30 y.o. Male  MRN: [id number] Visit Date: 03/26/2021  Today's healthcare provider: Megan Mans, MD   Chief Complaint  Patient presents with   Palpitations   Subjective    HPI  Patient states he palpitations for around 7 years. Patient states his heart rate will increase when he is sitting and watching TV. Patient states this happens occasionally. Each episode lasts anywhere from a few seconds to several minutes.  Patient advises me that he has autism and he asked me to speak to his mother.  He is anxious about this feeling of palpitations and wishes to stay cardiology, specifically Dr. Mariah Milling. He has no symptoms other than the palpitations on occasion.  He admits to being anxious.    Medications: Outpatient Medications Prior to Visit  Medication Sig   albuterol-ipratropium (COMBIVENT) 18-103 MCG/ACT inhaler Inhale 2 puffs into the lungs every 4 (four) hours as needed.    [DISCONTINUED] sertraline (ZOLOFT) 50 MG tablet Take 1 tablet (50 mg total) by mouth daily.   No facility-administered medications prior to visit.    Review of Systems  Constitutional:  Negative for appetite change, chills and fever.  Respiratory:  Negative for chest tightness, shortness of breath and wheezing.   Cardiovascular:  Negative for chest pain and palpitations.  Gastrointestinal:  Negative for abdominal pain, nausea and vomiting.       Objective    BP 126/81 (BP Location: Left Arm, Patient Position: Sitting, Cuff Size: Large)   Pulse 87   Temp 97.7 F (36.5 C) (Temporal)   Resp 16   Ht 5\' 8"  (1.727 m)   Wt 249 lb (112.9 kg)   SpO2 99%   BMI 37.86 kg/m  BP Readings from  Last 3 Encounters:  03/26/21 126/81  02/01/20 (!) 135/84  05/03/19 135/78   Wt Readings from Last 3 Encounters:  03/26/21 249 lb (112.9 kg)  02/01/20 (!) 230 lb 3.2 oz (104.4 kg)  05/03/19 208 lb (94.3 kg)      Physical Exam Vitals reviewed.  Constitutional:      General: He is not in acute distress.    Appearance: He is well-developed.  HENT:     Head: Normocephalic and atraumatic.     Right Ear: Hearing normal.     Left Ear: Hearing normal.     Nose: Nose normal.  Eyes:     General: Lids are normal. No scleral icterus.       Right eye: No discharge.        Left eye: No discharge.     Conjunctiva/sclera: Conjunctivae normal.  Cardiovascular:     Rate and Rhythm: Normal rate and regular rhythm.     Heart sounds: Normal heart sounds.  Pulmonary:     Effort: Pulmonary effort is normal. No respiratory distress.  Skin:    Findings: No lesion or rash.  Neurological:     General: No focal deficit present.     Mental Status: He is alert and oriented to person, place, and time.  Psychiatric:        Mood and Affect: Mood normal.        Speech: Speech normal.  Behavior: Behavior normal.    ECG reveals sinus rhythm anterior repolarization changes/ST elevation  No results found for any visits on 03/26/21.  Assessment & Plan     1. Heart palpitations  - EKG 12-Lead - CBC w/Diff/Platelet - Comprehensive Metabolic Panel (CMET) - TSH - Lipid panel - Ambulatory referral to Cardiology  2. Generalized anxiety disorder Increased sertraline from 50 mg to 100 mg daily. - CBC w/Diff/Platelet - Comprehensive Metabolic Panel (CMET) - TSH - Lipid panel - sertraline (ZOLOFT) 100 MG tablet; Take 1 tablet (100 mg total) by mouth daily.  Dispense: 90 tablet; Refill: 0  3. Autism  - CBC w/Diff/Platelet - Comprehensive Metabolic Panel (CMET) - TSH - Lipid panel   Return in about 1 month (around 04/25/2021).      I, Megan Mans, MD, have reviewed all  documentation for this visit. The documentation on 03/29/21 for the exam, diagnosis, procedures, and orders are all accurate and complete.    John Zulauf Wendelyn Breslow, MD  [facility] [phone] (phone) [phone] (fax)  [facility]

## 2021-03-27 LAB — CBC WITH DIFFERENTIAL/PLATELET
Basophils Absolute: 0 10*3/uL (ref 0.0–0.2)
Basos: 1 %
EOS (ABSOLUTE): 0.1 10*3/uL (ref 0.0–0.4)
Eos: 2 %
Hematocrit: 48.1 % (ref 37.5–51.0)
Hemoglobin: 16.3 g/dL (ref 13.0–17.7)
Immature Grans (Abs): 0 10*3/uL (ref 0.0–0.1)
Immature Granulocytes: 1 %
Lymphocytes Absolute: 0.9 10*3/uL (ref 0.7–3.1)
Lymphs: 23 %
MCH: 30 pg (ref 26.6–33.0)
MCHC: 33.9 g/dL (ref 31.5–35.7)
MCV: 89 fL (ref 79–97)
Monocytes Absolute: 0.8 10*3/uL (ref 0.1–0.9)
Monocytes: 18 %
Neutrophils Absolute: 2.3 10*3/uL (ref 1.4–7.0)
Neutrophils: 55 %
Platelets: 182 10*3/uL (ref 150–450)
RBC: 5.43 x10E6/uL (ref 4.14–5.80)
RDW: 12.2 % (ref 11.6–15.4)
WBC: 4.1 10*3/uL (ref 3.4–10.8)

## 2021-03-27 LAB — COMPREHENSIVE METABOLIC PANEL
ALT: 29 IU/L (ref 0–44)
AST: 23 IU/L (ref 0–40)
Albumin/Globulin Ratio: 2.3 — ABNORMAL HIGH (ref 1.2–2.2)
Albumin: 4.8 g/dL (ref 4.1–5.2)
Alkaline Phosphatase: 81 IU/L (ref 44–121)
BUN/Creatinine Ratio: 15 (ref 9–20)
BUN: 12 mg/dL (ref 6–20)
Bilirubin Total: 0.3 mg/dL (ref 0.0–1.2)
CO2: 25 mmol/L (ref 20–29)
Calcium: 9.5 mg/dL (ref 8.7–10.2)
Chloride: 101 mmol/L (ref 96–106)
Creatinine, Ser: 0.81 mg/dL (ref 0.76–1.27)
Globulin, Total: 2.1 g/dL (ref 1.5–4.5)
Glucose: 93 mg/dL (ref 65–99)
Potassium: 4.8 mmol/L (ref 3.5–5.2)
Sodium: 138 mmol/L (ref 134–144)
Total Protein: 6.9 g/dL (ref 6.0–8.5)
eGFR: 123 mL/min/{1.73_m2} (ref >59)

## 2021-03-27 LAB — LIPID PANEL
Chol/HDL Ratio: 6.5 ratio — ABNORMAL HIGH (ref 0.0–5.0)
Cholesterol, Total: 188 mg/dL (ref 100–199)
HDL: 29 mg/dL — ABNORMAL LOW (ref >39)
LDL Chol Calc (NIH): 126 mg/dL — ABNORMAL HIGH (ref 0–99)
Triglycerides: 186 mg/dL — ABNORMAL HIGH (ref 0–149)
VLDL Cholesterol Cal: 33 mg/dL (ref 5–40)

## 2021-03-27 LAB — TSH: TSH: 2.18 u[IU]/mL (ref 0.450–4.500)

## 2021-05-11 ENCOUNTER — Other Ambulatory Visit: Payer: Self-pay

## 2021-05-11 ENCOUNTER — Ambulatory Visit: Payer: Self-pay | Admitting: *Deleted

## 2021-05-11 ENCOUNTER — Ambulatory Visit
Admission: EM | Admit: 2021-05-11 | Discharge: 2021-05-11 | Disposition: A | Discharge status: Home / Self Care | Payer: BC Managed Care – PPO | Attending: Emergency Medicine | Admitting: Emergency Medicine

## 2021-05-11 ENCOUNTER — Encounter: Payer: Self-pay | Admitting: Emergency Medicine

## 2021-05-11 DIAGNOSIS — B349 Viral infection, unspecified: Secondary | ICD-10-CM

## 2021-05-11 MED ORDER — MOLNUPIRAVIR EUA 200MG CAPSULE: 4.0000 | ORAL_CAPSULE | Freq: Two times a day (BID) | ORAL | 0 refills | Status: AC

## 2021-05-11 NOTE — Discharge Instructions (Signed)
We will call you with any positive results from your COVID-19/Influenza testing completed in clinic today.  If you do not receive a phone call from us within the next 2-3 days, check your MyChart for up-to-date health information related to testing completed in clinic today.   For most people this is a self-limiting process and can take anywhere from 7 - 10 days to start feeling better. A cough can last up to 3 weeks. Pay special attention to handwashing as this can help prevent the spread of the virus.   Always read the labels of cough and cold medications as they may contain some of the ingredients below.  Rest, push lots of fluids (especially water), and utilize supportive care for symptoms. You may take acetaminophen (Tylenol) every 4-6 hours and ibuprofen every 6-8 hours for muscle pain, joint pain, headaches (you may also alternate these medications). Mucinex (guaifenesin) may be taken over the counter for cough as needed can loosen phlegm. Please read the instructions and take as directed.  Sudafed (pseudophedrine) is sold behind the counter and can help reduce nasal pressure; avoid taking this if you have high blood pressure or feel jittery. Sudafed PE (phenylephrine) can be a helpful, short-term, over-the-counter alternative to limit side effects or if you have high blood pressure.  Flonase nasal spray can help alleviate congestion and sinus pressure. Many patients choose Afrin as a nasal decongestant; do not use for more than 3 days for risk of rebound (increased symptoms after stopping medication).  Saline nasal sprays or rinses can also help nasal congestion (use bottled or sterile water). Warm tea with lemon and honey can sooth sore throat and cough, as can cough drops.   Return to clinic for high fever not improving with medications, chest pain, difficulty breathing, non-stop vomiting, or coughing blood. Follow-up with your primary care provider if symptoms do not improve as expected in  the next 5-7 days.  

## 2021-05-11 NOTE — ED Triage Notes (Signed)
Pt here with cough and sore throat and chills since Wednesday. Would like respiratory panel sent.

## 2021-05-11 NOTE — ED Provider Notes (Signed)
CHIEF COMPLAINT:   Chief Complaint  Patient presents with   Cough   Sore Throat     SUBJECTIVE/HPI:  HPI A very pleasant 30 y.o.Male presents today with cough, sore throat and chills that started on Wednesday. Patient does not report any shortness of breath, chest pain, palpitations, visual changes, weakness, tingling, headache, nausea, vomiting, diarrhea, fever.   has a past medical history of Asthma and Autism.  ROS:  Review of Systems See Subjective/HPI Medications, Allergies and Problem List personally reviewed in Epic today OBJECTIVE:   Vitals:   05/11/21 1354  BP: 118/81  Pulse: 72  Resp: 18  Temp: 98.2 F (36.8 C)  SpO2: 96%    Physical Exam   General: Appears well-developed and well-nourished. No acute distress.  HEENT Head: Normocephalic and atraumatic.   Ears: Hearing grossly intact, no drainage or visible deformity.  Nose: No nasal deviation.   Mouth/Throat: No stridor or tracheal deviation.  Non erythematous posterior pharynx noted with clear drainage present.  No white patchy exudate noted. Eyes: Conjunctivae and EOM are normal. No eye drainage or scleral icterus bilaterally.  Neck: Normal range of motion, neck is supple. No cervical, tonsillar or submandibular lymph nodes palpated.   Cardiovascular: Normal rate. Regular rhythm; no murmurs, gallops, or rubs.  Pulm/Chest: No respiratory distress. Breath sounds normal bilaterally without wheezes, rhonchi, or rales.  Neurological: Alert and oriented to person, place, and time.  Skin: Skin is warm and dry.  No rashes, lesions, abrasions or bruising noted to skin.   Psychiatric: Normal mood, affect, behavior, and thought content.   Vital signs and nursing note reviewed.   Patient stable and cooperative with examination. PROCEDURES:    LABS/X-RAYS/EKG/MEDS:   No results found for any visits on 05/11/21.  MEDICAL DECISION MAKING:   Patient presents with cough, sore throat and chills that started on  Wednesday. Patient does not report any shortness of breath, chest pain, palpitations, visual changes, weakness, tingling, headache, nausea, vomiting, diarrhea, fever.  Given symptoms along with assessment findings, likely viral illness.  COVID-19/influenza pending. Advised rest, fluids, Tylenol, ibuprofen, Mucinex and Sudafed.  Follow-up with PCP if not improving over the next 5 to 7 days.  Return for any new high fever not improved with medications, chest pain, difficulty breathing, nonstop vomiting or coughing up blood.  Work note printed.  Patient verbalized understanding and agreed with treatment plan.  Patient stable upon discharge. ASSESSMENT/PLAN:  1. Viral illness - Covid-19, Flu A+B ([facility]); Standing - Covid-19, Flu A+B ([facility])   Plan:   Discharge Instructions      We will call you with any positive results from your COVID-19/Influenza testing completed in clinic today.  If you do not receive a phone call from [place] within the next 2-3 days, check your MyChart for up-to-date health information related to testing completed in clinic today.  For most people this is a self-limiting process and can take anywhere from 7 - 10 days to start feeling better. A cough can last up to 3 weeks. Pay special attention to handwashing as this can help prevent the spread of the virus.   Always read the labels of cough and cold medications as they may contain some of the ingredients below.  Rest, push lots of fluids (especially water), and utilize supportive care for symptoms. You may take acetaminophen (Tylenol) every 4-6 hours and ibuprofen every 6-8 hours for muscle pain, joint pain, headaches (you may also alternate these medications). Mucinex (guaifenesin) may be taken over the counter  for cough as needed can loosen phlegm. Please read the instructions and take as directed.  Sudafed (pseudophedrine) is sold behind the counter and can help reduce nasal pressure; avoid taking this if you have high  blood pressure or feel jittery. Sudafed PE (phenylephrine) can be a helpful, short-term, over-the-counter alternative to limit side effects or if you have high blood pressure.  Flonase nasal spray can help alleviate congestion and sinus pressure. Many patients choose Afrin as a nasal decongestant; do not use for more than 3 days for risk of rebound (increased symptoms after stopping medication).  Saline nasal sprays or rinses can also help nasal congestion (use bottled or sterile water). Warm tea with lemon and honey can sooth sore throat and cough, as can cough drops.   Return to clinic for high fever not improving with medications, chest pain, difficulty breathing, non-stop vomiting, or coughing blood. Follow-up with your primary care provider if symptoms do not improve as expected in the next 5-7 days.          Amalia Greenhouse, FNP 05/11/21 1417

## 2021-05-11 NOTE — Telephone Encounter (Signed)
Pt is calling because he took a home test yesterday and tested positive for COVID. Pt is requesting medication. Pts father tested positive on 04/08/21. Pts sx include: cough, bodyaches.  Please advise   Attempted to call patient- left message to call office

## 2021-05-11 NOTE — Telephone Encounter (Signed)
Pt advised.   Thanks,   -Zaiya Annunziato  

## 2021-05-11 NOTE — Telephone Encounter (Signed)
Pt reports covid positive, home test yesterday. Household exposure with father. Reports cough, productive for clear mucous, "Mild today." States  chills Wednesday,sore throat. Reports sore throat "Just a little bit today." States afebrile. Denies any SOB. Pt states he is interested in oral anti-viral agent. Symptoms onset Wednesday. Home care advise given as well as review of self isolation guidelines. Reviewed symptoms that warrant an ED visit. Pt verbalizes understanding, Assured pt NT would route to practice for PCPs review.   CB# [phone]     Reason for Disposition  [1] COVID-19 diagnosed by positive lab test (e.g., PCR, rapid self-test kit) AND [2] mild symptoms (e.g., cough, fever, others) AND [2] no complications or SOB  Answer Assessment - Initial Assessment Questions 1. COVID-19 DIAGNOSIS: "Who made your COVID-19 diagnosis?" "Was it confirmed by a positive lab test or self-test?" If not diagnosed by a doctor (or NP/PA), ask "Are there lots of cases (community spread) where you live?" Note: See public health department website, if unsure.     Home test yesterday 2. COVID-19 EXPOSURE: "Was there any known exposure to COVID before the symptoms began?" CDC Definition of close contact: within 6 feet (2 meters) for a total of 15 minutes or more over a 24-hour period.      Yes father, household 3. ONSET: "When did the COVID-19 symptoms start?"      Wednesday 4. WORST SYMPTOM: "What is your worst symptom?" (e.g., cough, fever, shortness of breath, muscle aches)      5. COUGH: "Do you have a cough?" If Yes, ask: "How bad is the cough?"       Yes,clear mucous,minimal 6. FEVER: "Do you have a fever?" If Yes, ask: "What is your temperature, how was it measured, and when did it start?"     no 7. RESPIRATORY STATUS: "Describe your breathing?" (e.g., shortness of breath, wheezing, unable to speak)      no 8. BETTER-SAME-WORSE: "Are you getting better, staying the same or getting worse  compared to yesterday?"  If getting worse, ask, "In what way?"     Little better 9. HIGH RISK DISEASE: "Do you have any chronic medical problems?" (e.g., asthma, heart or lung disease, weak immune system, obesity, etc.)     *No Answer* 10. VACCINE: "Have you had the COVID-19 vaccine?" If Yes, ask: "Which one, how many shots, when did you get it?"       no 11. BOOSTER: "Have you received your COVID-19 booster?" If Yes, ask: "Which one and when did you get it?"       no 12. PREGNANCY: "Is there any chance you are pregnant?" "When was your last menstrual period?"       *No Answer* 13. OTHER SYMPTOMS: "Do you have any other symptoms?"  (e.g., chills, fatigue, headache, loss of smell or taste, muscle pain, sore throat)       HAd sore throat, mild now 14. O2 SATURATION MONITOR:  "Do you use an oxygen saturation monitor (pulse oximeter) at home?" If Yes, ask "What is your reading (oxygen level) today?" "What is your usual oxygen saturation reading?" (e.g., 95%)       NA  Protocols used: Coronavirus (COVID-19) Diagnosed or Suspected-A-AH

## 2021-05-11 NOTE — Telephone Encounter (Signed)
Paxlovid prescription sent to [facility]

## 2021-05-12 ENCOUNTER — Ambulatory Visit (INDEPENDENT_AMBULATORY_CARE_PROVIDER_SITE_OTHER): Payer: BC Managed Care – PPO | Admitting: Family Medicine

## 2021-05-12 DIAGNOSIS — F411 Generalized anxiety disorder: Secondary | ICD-10-CM | POA: Diagnosis not present

## 2021-05-12 DIAGNOSIS — F332 Major depressive disorder, recurrent severe without psychotic features: Secondary | ICD-10-CM

## 2021-05-12 NOTE — Progress Notes (Signed)
Established patient visit   Patient: John [name]   DOB: 1990/09/17   30 y.o. Male  MRN: [id number] Visit Date: 05/12/2021  Today's healthcare provider: Mila Merry, MD   No chief complaint on file.  Virtual Visit via Telephone Note  I connected with John [name] on 05/12/21 at  1:40 PM EST by telephone and verified that I am speaking with the correct person using two identifiers.  Location: Patient: Home Provider: [facility]   I discussed the limitations, risks, security and privacy concerns of performing an evaluation and management service by telephone and the availability of in person appointments. I also discussed with the patient that there may be a patient responsible charge related to this service. The patient expressed understanding and agreed to proceed.  Subjective    HPI   Pt tested positive for covid 05/11/2021. He states he is starting to feel better some.  Anxiety, Follow-up  He was last seen for anxiety 1  month  ago. Changes made at last visit include increased sertraline from 50 to 100mg  daily.   He reports excellent compliance with treatment. He reports excellent tolerance of treatment. He is not having side effects.   He feels his anxiety Improved since last visit. Is no longer having panic attacks.   Symptoms: No chest pain No difficulty concentrating  No dizziness No fatigue  No feelings of losing control No insomnia  No irritable No palpitations  No panic attacks No racing thoughts  No shortness of breath No sweating  No tremors/shakes    GAD-7 Results GAD-7 Generalized Anxiety Disorder Screening Tool 02/01/2020 06/07/2019  1. Feeling Nervous, Anxious, or on Edge 0 1  2. Not Being Able to Stop or Control Worrying 1 1  3. Worrying Too Much About Different Things 2 1  4. Trouble Relaxing 1 0  5. Being So Restless it's Hard To Sit Still 0 0  6. Becoming Easily Annoyed or Irritable 3 0  7. Feeling Afraid  As If Something Awful Might Happen 1 0  Total GAD-7 Score 8 3  Difficulty At Work, Home, or Getting  Along With Others? Not difficult at all Not difficult at all    PHQ-9 Scores PHQ9 SCORE ONLY 02/01/2020 10/20/2015  PHQ-9 Total Score 8 4    ---------------------------------------------------------------------------------------------------     Medications: Outpatient Medications Prior to Visit  Medication Sig   albuterol-ipratropium (COMBIVENT) 18-103 MCG/ACT inhaler Inhale 2 puffs into the lungs every 4 (four) hours as needed.    molnupiravir EUA (LAGEVRIO) 200 mg CAPS capsule Take 4 capsules (800 mg total) by mouth 2 (two) times daily for 5 days.   sertraline (ZOLOFT) 100 MG tablet Take 1 tablet (100 mg total) by mouth daily.   No facility-administered medications prior to visit.    Review of Systems  Constitutional:  Positive for fatigue. Negative for activity change, appetite change, chills, diaphoresis, fever and unexpected weight change.  HENT:  Positive for congestion and rhinorrhea. Negative for dental problem, drooling, ear discharge, ear pain, facial swelling, hearing loss, mouth sores, nosebleeds, postnasal drip, sinus pressure, sinus pain, sneezing, sore throat, tinnitus, trouble swallowing and voice change.   Respiratory:  Positive for cough. Negative for apnea, choking, chest tightness, shortness of breath, wheezing and stridor.   Cardiovascular: Negative.   Gastrointestinal: Negative.   Neurological:  Negative for dizziness, light-headedness and headaches.      Objective     Physical Exam  Awake, alert, oriented x 3. In  no apparent distress     Assessment & Plan     1. Severe episode of recurrent major depressive disorder, without psychotic features (HCC)   2. Generalized anxiety disorder  Do much better starting and titrating sertraline up to 100mg . Continue current medications.  Will schedule follow up in about 4 months.     I discussed the  assessment and treatment plan with the patient. The patient was provided an opportunity to ask questions and all were answered. The patient agreed with the plan and demonstrated an understanding of the instructions.   The patient was advised to call back or seek an in-person evaluation if the symptoms worsen or if the condition fails to improve as anticipated.  I provided 8 minutes of non-face-to-face time during this encounter.       The entirety of the information documented in the History of Present Illness, Review of Systems and Physical Exam were personally obtained by me. Portions of this information were initially documented by the CMA and reviewed by me for thoroughness and accuracy.     , MD  [facility] [phone] (phone) [phone] (fax)  [facility]

## 2021-05-13 LAB — COVID-19, FLU A+B NAA
Influenza A, NAA: NOT DETECTED
Influenza B, NAA: NOT DETECTED
SARS-CoV-2, NAA: DETECTED — AB

## 2021-06-16 ENCOUNTER — Other Ambulatory Visit: Payer: Self-pay | Admitting: Family Medicine

## 2021-06-16 DIAGNOSIS — F411 Generalized anxiety disorder: Secondary | ICD-10-CM

## 2021-06-16 MED ORDER — SERTRALINE HCL 100 MG PO TABS: 100.0000 mg | ORAL_TABLET | Freq: Every day | ORAL | 2 refills | Status: DC

## 2022-03-29 ENCOUNTER — Encounter: Payer: Self-pay | Admitting: Family Medicine

## 2022-03-29 ENCOUNTER — Ambulatory Visit: Payer: BC Managed Care – PPO | Admitting: Family Medicine

## 2022-03-29 ENCOUNTER — Ambulatory Visit (INDEPENDENT_AMBULATORY_CARE_PROVIDER_SITE_OTHER): Payer: BC Managed Care – PPO | Admitting: Family Medicine

## 2022-03-29 VITALS — BP 120/84 | HR 102 | Resp 16 | Ht 68.0 in | Wt 243.0 lb

## 2022-03-29 DIAGNOSIS — F332 Major depressive disorder, recurrent severe without psychotic features: Secondary | ICD-10-CM

## 2022-03-29 DIAGNOSIS — F411 Generalized anxiety disorder: Secondary | ICD-10-CM

## 2022-03-29 MED ORDER — SERTRALINE HCL 100 MG PO TABS: 100.0000 mg | ORAL_TABLET | Freq: Every day | ORAL | 3 refills | Status: DC

## 2022-03-29 NOTE — Assessment & Plan Note (Signed)
GAD and PHQ scoring borderline but feels symptoms are manageable. No med changes for now. Discussed counseling and synergistic effect, wants to think about it for now. F/u 6 months.

## 2022-03-29 NOTE — Progress Notes (Signed)
    SUBJECTIVE:   CHIEF COMPLAINT / HPI:   Anxiety, Depression - Medications: sertraline - Taking: good compliance - Counseling: no - Previous hospitalizations: no - Symptoms: worries a lot, sometimes gets down on himself. Not much motivation to exercise.  - Current stressors: work stress - Coping Mechanisms: talking with parents - hasn't had panic attacks recently. - feels symptoms are manageable.     03/29/2022    1:58 PM 02/01/2020    9:22 AM 10/20/2015    2:34 PM  Depression screen PHQ 2/9  Decreased Interest 1 3 0  Down, Depressed, Hopeless 3 1 1   PHQ - 2 Score 4 4 1   Altered sleeping 0 0 0  Tired, decreased energy 1 2 1   Change in appetite 0 0 0  Feeling bad or failure about yourself  2 2 1   Trouble concentrating 2 0 0  Moving slowly or fidgety/restless 0 0 0  Suicidal thoughts 0 0 1  PHQ-9 Score 9 8 4   Difficult doing work/chores Very difficult Not difficult at all Not difficult at all      03/29/2022    2:00 PM 02/01/2020    9:23 AM 06/07/2019    2:24 PM  GAD 7 : Generalized Anxiety Score  Nervous, Anxious, on Edge 1 0 1  Control/stop worrying 1 1 1   Worry too much - different things 1 2 1   Trouble relaxing 1 1 0  Restless 0 0 0  Easily annoyed or irritable 1 3 0  Afraid - awful might happen 1 1 0  Total GAD 7 Score 6 8 3   Anxiety Difficulty  Not difficult at all Not difficult at all     OBJECTIVE:   BP 120/84 (BP Location: Left Arm, Patient Position: Sitting, Cuff Size: Large)   Pulse (!) 102   Resp 16   Ht 5\' 8"  (1.727 m)   Wt 243 lb (110.2 kg)   SpO2 98%   BMI 36.95 kg/m   Gen: well appearing, in NAD Card: Reg rate Lungs: Comfortable WOB on RA Ext: WWP, no edema Psych: pleasant, mood and affect euthymic.   ASSESSMENT/PLAN:   Generalized anxiety disorder GAD and PHQ scoring borderline but feels symptoms are manageable. No med changes for now. Discussed counseling and synergistic effect, wants to think about it for now. F/u 6  months.  Major depressive disorder, recurrent episode, severe (HCC) GAD and PHQ scoring borderline but feels symptoms are manageable. No med changes for now. Discussed counseling and synergistic effect, wants to think about it for now. F/u 6 months.     Myles Gip, DO

## 2022-03-29 NOTE — Assessment & Plan Note (Addendum)
GAD and PHQ scoring borderline but feels symptoms are manageable. No med changes for now. Discussed counseling and synergistic effect, wants to think about it for now. F/u 6 months. 

## 2022-10-01 ENCOUNTER — Ambulatory Visit: Payer: Self-pay | Admitting: Family Medicine

## 2022-10-01 NOTE — Progress Notes (Deleted)
I,[name],acting as a Education administrator for John Huh, MD.,have documented all relevant documentation on the behalf of John Huh, MD,as directed by  John Huh, MD while in the presence of John Huh, MD.   Established patient visit   Patient: John [name]   DOB: 08-Nov-1990   32 y.o. Male  MRN: [id number] Visit Date: 10/01/2022  Today's healthcare provider: Lelon Huh, MD   No chief complaint on file.  Subjective    HPI  Anxiety, Follow-up  He was last seen for anxiety 6 months ago. Changes made at last visit include no med changes for now. Discussed counseling and synergistic effect, wants to think about it for now .  He feels his anxiety is {Desc; severity:60313} and {improved/worse/unchanged:3041574} since last visit. Symptoms: {Yes/No:20286} chest pain {Yes/No:20286} difficulty concentrating  {Yes/No:20286} dizziness {Yes/No:20286} fatigue  {Yes/No:20286} feelings of losing control {Yes/No:20286} insomnia  {Yes/No:20286} irritable {Yes/No:20286} palpitations  {Yes/No:20286} panic attacks {Yes/No:20286} racing thoughts  {Yes/No:20286} shortness of breath {Yes/No:20286} sweating  {Yes/No:20286} tremors/shakes    GAD-7 Results    03/29/2022    2:00 PM 02/01/2020    9:23 AM 06/07/2019    2:24 PM  GAD-7 Generalized Anxiety Disorder Screening Tool  1. Feeling Nervous, Anxious, or on Edge 1 0 1  2. Not Being Able to Stop or Control Worrying 1 1 1   3. Worrying Too Much About Different Things 1 2 1   4. Trouble Relaxing 1 1 0  5. Being So Restless it's Hard To Sit Still 0 0 0  6. Becoming Easily Annoyed or Irritable 1 3 0  7. Feeling Afraid As If Something Awful Might Happen 1 1 0  Total GAD-7 Score 6 8 3   Difficulty At Work, Home, or Getting  Along With Others?  Not difficult at all Not difficult at all   Depression, Follow-up          Current symptoms include: {Symptoms; depression:1002} He feels he is {improved/worse/unchanged:3041574} since last  visit.     03/29/2022    1:58 PM 02/01/2020    9:22 AM 10/20/2015    2:34 PM  Depression screen PHQ 2/9  Decreased Interest 1 3 0  Down, Depressed, Hopeless 3 1 1   PHQ - 2 Score 4 4 1   Altered sleeping 0 0 0  Tired, decreased energy 1 2 1   Change in appetite 0 0 0  Feeling bad or failure about yourself  2 2 1   Trouble concentrating 2 0 0  Moving slowly or fidgety/restless 0 0 0  Suicidal thoughts 0 0 1  PHQ-9 Score 9 8 4   Difficult doing work/chores Very difficult Not difficult at all Not difficult at all  ---------------------------------------------------------------------------------------------------   Medications: Outpatient Medications Prior to Visit  Medication Sig   albuterol-ipratropium (COMBIVENT) 18-103 MCG/ACT inhaler Inhale 2 puffs into the lungs every 4 (four) hours as needed.    sertraline (ZOLOFT) 100 MG tablet Take 1 tablet (100 mg total) by mouth daily.   No facility-administered medications prior to visit.    Review of Systems  {Labs  Heme  Chem  Endocrine  Serology  Results Review (optional):23779}   Objective    There were no vitals taken for this visit. {Show previous vital signs (optional):23777}  Physical Exam  ***  No results found for any visits on 10/01/22.  Assessment & Plan     ***  No follow-ups on file.      {provider attestation***:1}   John Huh, MD  [facility] [phone] (phone) [phone] (fax)  [place]

## 2022-10-11 ENCOUNTER — Ambulatory Visit (INDEPENDENT_AMBULATORY_CARE_PROVIDER_SITE_OTHER): Payer: BC Managed Care – PPO | Admitting: Family Medicine

## 2022-10-11 VITALS — BP 127/89 | HR 77 | Temp 98.6°F | Wt 239.0 lb

## 2022-10-11 DIAGNOSIS — F845 Asperger's syndrome: Secondary | ICD-10-CM | POA: Diagnosis not present

## 2022-10-11 DIAGNOSIS — F332 Major depressive disorder, recurrent severe without psychotic features: Secondary | ICD-10-CM

## 2022-10-11 DIAGNOSIS — F411 Generalized anxiety disorder: Secondary | ICD-10-CM | POA: Diagnosis not present

## 2022-10-11 NOTE — Progress Notes (Signed)
Vivien Rota DeSanto,acting as a scribe for Mila Merry, MD.,have documented all relevant documentation on the behalf of Mila Merry, MD,as directed by  Mila Merry, MD while in the presence of Mila Merry, MD.     Established patient visit   Patient: John [name]   DOB: December 31, 1990   32 y.o. Male  MRN: [id number] Visit Date: 10/11/2022  Today's healthcare provider: Mila Merry, MD    Subjective    HPI  Anxiety and Depression, Follow-up  He was last seen for anxiety 7 months ago. Changes made at last visit include none.   He reports good compliance with treatment. He reports good tolerance of treatment. He is not having side effects.   He feels his anxiety is mild and  feels the same but slowly getting better  since last visit. 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   Symptoms: No chest pain No difficulty concentrating  No dizziness No fatigue  No feelings of losing control No insomnia  No irritable Yes palpitations  No panic attacks No racing thoughts  No shortness of breath No sweating  No tremors/shakes    GAD-7 Results    03/29/2022    2:00 PM 02/01/2020    9:23 AM 06/07/2019    2:24 PM  GAD-7 Generalized Anxiety Disorder Screening Tool  1. Feeling Nervous, Anxious, or on Edge 1 0 1  2. Not Being Able to Stop or Control Worrying 1 1 1   3. Worrying Too Much About Different Things 1 2 1   4. Trouble Relaxing 1 1 0  5. Being So Restless it's Hard To Sit Still 0 0 0  6. Becoming Easily Annoyed or Irritable 1 3 0  7. Feeling Afraid As If Something Awful Might Happen 1 1 0  Total GAD-7 Score 6 8 3   Difficulty At Work, Home, or Getting  Along With Others?  Not difficult at all Not difficult at all    PHQ-9 Scores    10/11/2022    3:02 PM 03/29/2022    1:58 PM 02/01/2020    9:22 AM  PHQ9 SCORE ONLY  PHQ-9 Total Score 5 9 8      ---------------------------------------------------------------------------------------------------   Medications: Outpatient Medications Prior to Visit  Medication Sig   albuterol-ipratropium (COMBIVENT) 18-103 MCG/ACT inhaler Inhale 2 puffs into the lungs every 4 (four) hours as needed.    sertraline (ZOLOFT) 100 MG tablet Take 1 tablet (100 mg total) by mouth daily.   No facility-administered medications prior to visit.    Review of Systems  Constitutional:  Negative for appetite change, chills and fever.  Respiratory:  Negative for chest tightness, shortness of breath and wheezing.   Cardiovascular:  Negative for chest pain and palpitations.  Gastrointestinal:  Negative for abdominal pain, nausea and vomiting.        Objective    BP 127/89 (BP Location: Left Arm, Patient Position: Sitting, Cuff Size: Normal)   Pulse 77   Temp 98.6 F (37 C) (Oral)   Wt 239 lb (108.4 kg)   SpO2 100%   BMI 36.34 kg/m   Physical Exam   General: Appearance:    Mildly obese male in no acute distress  Eyes:    PERRL, conjunctiva/corneas clear, EOM's intact       Lungs:     Clear to auscultation bilaterally, respirations unlabored  Heart:    Normal heart rate. Normal rhythm. No murmurs, rubs, or gallops.    MS:  All extremities are intact.    Neurologic:   Awake, alert, oriented x 3. No apparent focal neurological defect.        Assessment & Plan     1. Generalized anxiety disorder   2. Asperger syndrome   3. Severe episode of recurrent major depressive disorder, without psychotic features Doing very well on current dose of sertraline now working stocking groceries at [employer]. Continue current medications.  Follow up yearly.       The entirety of the information documented in the History of Present Illness, Review of Systems and Physical Exam were personally obtained by me. Portions of this information were initially documented by the CMA and reviewed by me for thoroughness and  accuracy.     Mila Merry, MD  [facility] [phone] (phone) [phone] (fax)  [facility]

## 2022-10-11 NOTE — Patient Instructions (Signed)
.   Please review the attached list of medications and notify my office if there are any errors.   . Please bring all of your medications to every appointment so we can make sure that our medication list is the same as yours.   

## 2023-03-08 ENCOUNTER — Other Ambulatory Visit: Payer: Self-pay | Admitting: Family Medicine

## 2023-03-08 DIAGNOSIS — F411 Generalized anxiety disorder: Secondary | ICD-10-CM

## 2023-03-09 NOTE — Telephone Encounter (Signed)
Requested Prescriptions  Pending Prescriptions Disp Refills   sertraline (ZOLOFT) 100 MG tablet [Pharmacy Med Name: Sertraline HCl 100 MG Oral Tablet] 90 tablet 0    Sig: Take 1 tablet by mouth once daily     Psychiatry:  Antidepressants - SSRI - sertraline Failed - 03/08/2023  6:42 AM      Failed - AST in normal range and within 360 days    AST  Date Value Ref Range Status  03/26/2021 23 0 - 40 IU/L Final         Failed - ALT in normal range and within 360 days    ALT  Date Value Ref Range Status  03/26/2021 29 0 - 44 IU/L Final         Passed - Completed PHQ-2 or PHQ-9 in the last 360 days      Passed - Valid encounter within last 6 months    Recent Outpatient Visits           4 months ago Generalized anxiety disorder   [facility] Malva Limes, MD   11 months ago Severe episode of recurrent major depressive disorder, without psychotic features [facility])   [facility] Caro Laroche, DO   1 year ago Severe episode of recurrent major depressive disorder, without psychotic features [facility])   [facility] Malva Limes, MD   1 year ago Heart palpitations   [facility] Bosie Clos, MD   3 years ago Generalized anxiety disorder   [facility] Malva Limes, MD       Future Appointments             In 1 month Fisher, Demetrios Isaacs, MD [facility]

## 2023-04-15 ENCOUNTER — Ambulatory Visit: Payer: BC Managed Care – PPO | Admitting: Family Medicine

## 2023-04-15 VITALS — BP 127/77 | HR 77 | Temp 97.9°F | Resp 20 | Ht 68.0 in | Wt 237.9 lb

## 2023-04-15 DIAGNOSIS — E785 Hyperlipidemia, unspecified: Secondary | ICD-10-CM | POA: Diagnosis not present

## 2023-04-15 DIAGNOSIS — F845 Asperger's syndrome: Secondary | ICD-10-CM | POA: Diagnosis not present

## 2023-04-15 DIAGNOSIS — F411 Generalized anxiety disorder: Secondary | ICD-10-CM

## 2023-04-15 DIAGNOSIS — F332 Major depressive disorder, recurrent severe without psychotic features: Secondary | ICD-10-CM

## 2023-04-15 NOTE — Progress Notes (Signed)
      Established patient visit   Patient: John [name]   DOB: 05-02-1991   32 y.o. Male  MRN: [id number] Visit Date: 04/15/2023  Today's healthcare provider: Mila Merry, MD   Chief Complaint  Patient presents with   Follow-up    6 mo f/u    Subjective    Discussed the use of AI scribe software for clinical note transcription with the patient, who gave verbal consent to proceed.  History of Present Illness   The patient, currently on sertraline for depression and anxiety, reports overall good mood and stress levels that fluctuate but have significantly reduced since a change in job position at their current workplace, [employer]. 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  The patient has a history of exercise-induced respiratory issues, for which they used an inhaler in the past, primarily during sports activities. However, they have not needed to use the inhaler for a long time and do not require a refill for it.  Sleep quality is reported as good with no issues resting at night. The patient recently received a 90-day supply of sertraline and does not require any immediate refills.  The patient's cholesterol was checked a couple of years ago, with results indicating slightly low HDL slightly high LDL. The patient has agreed to have this checked again in the near future. They work mostly mornings from Sunday through Friday.       Medications: Outpatient Medications Prior to Visit  Medication Sig   albuterol-ipratropium (COMBIVENT) 18-103 MCG/ACT inhaler Inhale 2 puffs into the lungs every 4 (four) hours as needed.    sertraline (ZOLOFT) 100 MG tablet Take 1 tablet by mouth once daily   No facility-administered medications prior to visit.   Review of Systems     Objective    BP 127/77 (BP Location: Left Arm, Patient Position: Sitting, Cuff Size: Normal)   Pulse 77   Temp 97.9 F (36.6 C)    Resp 20   Ht 5\' 8"  (1.727 m)   Wt 237 lb 14.4 oz (107.9 kg)   SpO2 100%   BMI 36.17 kg/m   Physical Exam  Physical Exam        No results found for any visits on 04/15/23.  Assessment & Plan        Depression/anxiety Stable mood on Sertraline. No reported sleep disturbances. Stress level reduced after changing job positions. -Continue Sertraline. Pharmacy [facility]) notified for continued refills.  Exercise-induced Asthma No recent need for Combivent use. No reported symptoms. -No need for inhaler refill.  dyslipidemia Last cholesterol check showed low HDL and high LDL. Patient has not fasted today. -Order non-fasting direct LDL cholesterol test. Patient to complete at their convenience in the next few months.    No follow-ups on file.      Mila Merry, MD  [facility] [phone] (phone) [phone] (fax)  [facility]

## 2023-05-27 ENCOUNTER — Telehealth: Payer: Self-pay | Admitting: Family Medicine

## 2023-06-01 ENCOUNTER — Ambulatory Visit (INDEPENDENT_AMBULATORY_CARE_PROVIDER_SITE_OTHER): Payer: BC Managed Care – PPO | Admitting: Family Medicine

## 2023-06-01 ENCOUNTER — Encounter: Payer: Self-pay | Admitting: Family Medicine

## 2023-06-01 VITALS — BP 126/79 | HR 76 | Ht 68.0 in | Wt 239.0 lb

## 2023-06-01 DIAGNOSIS — R7309 Other abnormal glucose: Secondary | ICD-10-CM | POA: Diagnosis not present

## 2023-06-01 DIAGNOSIS — F39 Unspecified mood [affective] disorder: Secondary | ICD-10-CM | POA: Diagnosis not present

## 2023-06-01 DIAGNOSIS — F411 Generalized anxiety disorder: Secondary | ICD-10-CM | POA: Diagnosis not present

## 2023-06-01 DIAGNOSIS — I10 Essential (primary) hypertension: Secondary | ICD-10-CM | POA: Insufficient documentation

## 2023-06-01 DIAGNOSIS — Z1159 Encounter for screening for other viral diseases: Secondary | ICD-10-CM | POA: Insufficient documentation

## 2023-06-01 DIAGNOSIS — E785 Hyperlipidemia, unspecified: Secondary | ICD-10-CM | POA: Diagnosis not present

## 2023-06-01 DIAGNOSIS — Z114 Encounter for screening for human immunodeficiency virus [HIV]: Secondary | ICD-10-CM | POA: Insufficient documentation

## 2023-06-01 MED ORDER — SERTRALINE HCL 100 MG PO TABS: 100.0000 mg | ORAL_TABLET | Freq: Every day | ORAL | 3 refills | Status: DC

## 2023-06-01 NOTE — Assessment & Plan Note (Signed)
Chronic, stable    06/01/2023    9:03 AM 03/29/2022    2:00 PM 02/01/2020    9:23 AM 06/07/2019    2:24 PM  GAD 7 : Generalized Anxiety Score  Nervous, Anxious, on Edge 0 1 0 1  Control/stop worrying 0 1 1 1   Worry too much - different things 1 1 2 1   Trouble relaxing 0 1 1 0  Restless 0 0 0 0  Easily annoyed or irritable 1 1 3  0  Afraid - awful might happen 1 1 1  0  Total GAD 7 Score 3 6 8 3   Anxiety Difficulty Not difficult at all  Not difficult at all Not difficult at all    Continue zoloft 100 mg every day

## 2023-06-01 NOTE — Assessment & Plan Note (Signed)
Chronic, stable Continue to monitor Add Vit D to assist medication (Zoloft 100mg  every day)

## 2023-06-01 NOTE — Assessment & Plan Note (Signed)
Low risk screen ?Consented; encouraged to "know your status" ?Recommend repeat screen if risk factors change ? ?

## 2023-06-01 NOTE — Assessment & Plan Note (Signed)
Low risk screen Treatable, and curable. If left untreated Hep C can lead to cirrhosis and liver failure. Encourage routine testing; recommend repeat testing if risk factors change.  

## 2023-06-01 NOTE — Assessment & Plan Note (Signed)
Chronic, unknown Repeat LP LDL goal <100, trig <150, total <200, HDL >40 I continue to recommend diet low in saturated fat and regular exercise - 30 min at least 5 times per week

## 2023-06-01 NOTE — Assessment & Plan Note (Signed)
Recommend A1c given previously elevated BG with Body mass index is 36.34 kg/m. Continue to recommend balanced, lower carb meals. Smaller meal size, adding snacks. Choosing water as drink of choice and increasing purposeful exercise.

## 2023-06-01 NOTE — Progress Notes (Signed)
Established patient visit   Patient: John [name]   DOB: 1991-01-31   32 y.o. Male  MRN: [id number] Visit Date: 06/01/2023  Today's healthcare provider: Jacky Kindle, FNP  Re Introduced to nurse practitioner role and practice setting.  All questions answered.  Discussed provider/patient relationship and expectations.  Subjective    HPI HPI   2 months f/u. Last edited by Shelly Bombard, CMA on 06/01/2023  8:58 AM.       Seen by PCP, Dr Sherrie Mustache on 04/15/23, continued on Zoloft 100 mg for GAD.     06/01/2023    9:03 AM 03/29/2022    2:00 PM 02/01/2020    9:23 AM 06/07/2019    2:24 PM  GAD 7 : Generalized Anxiety Score  Nervous, Anxious, on Edge 0 1 0 1  Control/stop worrying 0 1 1 1   Worry too much - different things 1 1 2 1   Trouble relaxing 0 1 1 0  Restless 0 0 0 0  Easily annoyed or irritable 1 1 3  0  Afraid - awful might happen 1 1 1  0  Total GAD 7 Score 3 6 8 3   Anxiety Difficulty Not difficult at all  Not difficult at all Not difficult at all   Medications: Outpatient Medications Prior to Visit  Medication Sig   albuterol-ipratropium (COMBIVENT) 18-103 MCG/ACT inhaler Inhale 2 puffs into the lungs every 4 (four) hours as needed.    [DISCONTINUED] sertraline (ZOLOFT) 100 MG tablet Take 1 tablet by mouth once daily   No facility-administered medications prior to visit.   Last CBC Lab Results  Component Value Date   WBC 4.1 03/26/2021   HGB 16.3 03/26/2021   HCT 48.1 03/26/2021   MCV 89 03/26/2021   MCH 30.0 03/26/2021   RDW 12.2 03/26/2021   PLT 182 03/26/2021   Last metabolic panel Lab Results  Component Value Date   GLUCOSE 93 03/26/2021   NA 138 03/26/2021   K 4.8 03/26/2021   CL 101 03/26/2021   CO2 25 03/26/2021   BUN 12 03/26/2021   CREATININE 0.81 03/26/2021   EGFR 123 03/26/2021   CALCIUM 9.5 03/26/2021   PROT 6.9 03/26/2021   ALBUMIN 4.8 03/26/2021   LABGLOB 2.1 03/26/2021   AGRATIO 2.3 (H) 03/26/2021   BILITOT 0.3  03/26/2021   ALKPHOS 81 03/26/2021   AST 23 03/26/2021   ALT 29 03/26/2021   Last lipids Lab Results  Component Value Date   CHOL 188 03/26/2021   HDL 29 (L) 03/26/2021   LDLCALC 126 (H) 03/26/2021   TRIG 186 (H) 03/26/2021   CHOLHDL 6.5 (H) 03/26/2021   Last thyroid functions Lab Results  Component Value Date   TSH 2.180 03/26/2021     Objective    BP 126/79 (BP Location: Left Arm, Patient Position: Sitting, Cuff Size: Large)   Pulse 76   Ht 5\' 8"  (1.727 m)   Wt 239 lb (108.4 kg)   SpO2 97%   BMI 36.34 kg/m   BP Readings from Last 3 Encounters:  06/01/23 126/79  04/15/23 127/77  10/11/22 127/89   Wt Readings from Last 3 Encounters:  06/01/23 239 lb (108.4 kg)  04/15/23 237 lb 14.4 oz (107.9 kg)  10/11/22 239 lb (108.4 kg)   Physical Exam Vitals and nursing note reviewed.  Constitutional:      Appearance: Normal appearance. He is obese.  HENT:     Head: Normocephalic and atraumatic.  Cardiovascular:  Rate and Rhythm: Normal rate and regular rhythm.     Pulses: Normal pulses.     Heart sounds: Normal heart sounds.  Pulmonary:     Effort: Pulmonary effort is normal.     Breath sounds: Normal breath sounds.  Musculoskeletal:        General: Normal range of motion.     Cervical back: Normal range of motion.  Skin:    General: Skin is warm and dry.     Capillary Refill: Capillary refill takes less than 2 seconds.  Neurological:     Mental Status: He is alert and oriented to person, place, and time. Mental status is at baseline.     Comments: Hx of Aspergers- no concerns noted  Psychiatric:        Mood and Affect: Mood normal.        Behavior: Behavior normal.        Thought Content: Thought content normal.        Judgment: Judgment normal.     No results found for any visits on 06/01/23.  Assessment & Plan     Problem List Items Addressed This Visit       Cardiovascular and Mediastinum   Elevated blood pressure reading in office with diagnosis  of hypertension    BP at 126/79 today; continue to monitor with diet and exercise Reports stable exercise induced asthma at this time Goal remains 119/79 Recommend CBC, CMP, TSH      Relevant Orders   CBC with Differential/Platelet   TSH   Comprehensive Metabolic Panel (CMET)     Other   Dyslipidemia    Chronic, unknown Repeat LP LDL goal <100, trig <150, total <200, HDL >40 I continue to recommend diet low in saturated fat and regular exercise - 30 min at least 5 times per week       Relevant Orders   Lipid panel   Encounter for hepatitis C screening test for low risk patient    Low risk screen Treatable, and curable. If left untreated Hep C can lead to cirrhosis and liver failure. Encourage routine testing; recommend repeat testing if risk factors change.       Relevant Orders   Hepatitis C Antibody   Encounter for screening for HIV    Low risk screen Consented; encouraged to "know your status" Recommend repeat screen if risk factors change       Relevant Orders   HIV antibody (with reflex)   Generalized anxiety disorder    Chronic, stable    06/01/2023    9:03 AM 03/29/2022    2:00 PM 02/01/2020    9:23 AM 06/07/2019    2:24 PM  GAD 7 : Generalized Anxiety Score  Nervous, Anxious, on Edge 0 1 0 1  Control/stop worrying 0 1 1 1   Worry too much - different things 1 1 2 1   Trouble relaxing 0 1 1 0  Restless 0 0 0 0  Easily annoyed or irritable 1 1 3  0  Afraid - awful might happen 1 1 1  0  Total GAD 7 Score 3 6 8 3   Anxiety Difficulty Not difficult at all  Not difficult at all Not difficult at all    Continue zoloft 100 mg every day       Relevant Medications   sertraline (ZOLOFT) 100 MG tablet   Impaired glucose regulation    Recommend A1c given previously elevated BG with Body mass index is 36.34 kg/m. Continue to recommend balanced,  lower carb meals. Smaller meal size, adding snacks. Choosing water as drink of choice and increasing purposeful  exercise.       Relevant Orders   Hemoglobin A1c   Mood disorder (HCC) - Primary    Chronic, stable Continue to monitor Add Vit D to assist medication (Zoloft 100mg  every day)       Relevant Orders   Vitamin D (25 hydroxy)   Return in about 6 months (around 11/29/2023) for annual examination.     Leilani Merl, FNP, have reviewed all documentation for this visit. The documentation on 06/01/23 for the exam, diagnosis, procedures, and orders are all accurate and complete.  Jacky Kindle, FNP  [facility] [phone] (phone) [phone] (fax)  [facility]

## 2023-06-01 NOTE — Assessment & Plan Note (Signed)
BP at 126/79 today; continue to monitor with diet and exercise Reports stable exercise induced asthma at this time Goal remains 119/79 Recommend CBC, CMP, TSH

## 2023-06-02 LAB — CBC WITH DIFFERENTIAL/PLATELET
Basophils Absolute: 0 10*3/uL (ref 0.0–0.2)
Basos: 0 %
EOS (ABSOLUTE): 0.1 10*3/uL (ref 0.0–0.4)
Eos: 2 %
Hematocrit: 45.8 % (ref 37.5–51.0)
Hemoglobin: 15.4 g/dL (ref 13.0–17.7)
Immature Grans (Abs): 0 10*3/uL (ref 0.0–0.1)
Immature Granulocytes: 0 %
Lymphocytes Absolute: 1.6 10*3/uL (ref 0.7–3.1)
Lymphs: 24 %
MCH: 30.6 pg (ref 26.6–33.0)
MCHC: 33.6 g/dL (ref 31.5–35.7)
MCV: 91 fL (ref 79–97)
Monocytes Absolute: 0.5 10*3/uL (ref 0.1–0.9)
Monocytes: 7 %
Neutrophils Absolute: 4.5 10*3/uL (ref 1.4–7.0)
Neutrophils: 67 %
Platelets: 256 10*3/uL (ref 150–450)
RBC: 5.04 x10E6/uL (ref 4.14–5.80)
RDW: 12.5 % (ref 11.6–15.4)
WBC: 6.7 10*3/uL (ref 3.4–10.8)

## 2023-06-02 LAB — HEMOGLOBIN A1C
Est. average glucose Bld gHb Est-mCnc: 100 mg/dL
Hgb A1c MFr Bld: 5.1 % (ref 4.8–5.6)

## 2023-06-02 LAB — COMPREHENSIVE METABOLIC PANEL
ALT: 25 [IU]/L (ref 0–44)
AST: 19 [IU]/L (ref 0–40)
Albumin: 4.7 g/dL (ref 4.3–5.2)
Alkaline Phosphatase: 85 [IU]/L (ref 44–121)
BUN/Creatinine Ratio: 24 — ABNORMAL HIGH (ref 9–20)
BUN: 19 mg/dL (ref 6–20)
Bilirubin Total: 0.4 mg/dL (ref 0.0–1.2)
CO2: 22 mmol/L (ref 20–29)
Calcium: 9.7 mg/dL (ref 8.7–10.2)
Chloride: 101 mmol/L (ref 96–106)
Creatinine, Ser: 0.8 mg/dL (ref 0.76–1.27)
Globulin, Total: 2 g/dL (ref 1.5–4.5)
Glucose: 85 mg/dL (ref 70–99)
Potassium: 4.2 mmol/L (ref 3.5–5.2)
Sodium: 139 mmol/L (ref 134–144)
Total Protein: 6.7 g/dL (ref 6.0–8.5)
eGFR: 122 mL/min/{1.73_m2} (ref >59)

## 2023-06-02 LAB — HIV ANTIBODY (ROUTINE TESTING W REFLEX): HIV Screen 4th Generation wRfx: NONREACTIVE

## 2023-06-02 LAB — LIPID PANEL
Chol/HDL Ratio: 5.5 {ratio} — ABNORMAL HIGH (ref 0.0–5.0)
Cholesterol, Total: 166 mg/dL (ref 100–199)
HDL: 30 mg/dL — ABNORMAL LOW (ref >39)
LDL Chol Calc (NIH): 113 mg/dL — ABNORMAL HIGH (ref 0–99)
Triglycerides: 127 mg/dL (ref 0–149)
VLDL Cholesterol Cal: 23 mg/dL (ref 5–40)

## 2023-06-02 LAB — VITAMIN D 25 HYDROXY (VIT D DEFICIENCY, FRACTURES): Vit D, 25-Hydroxy: 63.1 ng/mL (ref 30.0–100.0)

## 2023-06-02 LAB — HEPATITIS C ANTIBODY: Hep C Virus Ab: NONREACTIVE

## 2023-06-02 LAB — TSH: TSH: 3.09 u[IU]/mL (ref 0.450–4.500)

## 2023-06-06 NOTE — Progress Notes (Signed)
Cholesterol is improved; I continue to recommend diet low in saturated fat and regular exercise - 30 min at least 5 times per week  All other labs are normal and stable.

## 2023-06-17 ENCOUNTER — Ambulatory Visit: Payer: BC Managed Care – PPO | Admitting: Family Medicine

## 2023-11-30 ENCOUNTER — Encounter: Payer: Self-pay | Admitting: Family Medicine

## 2023-12-28 ENCOUNTER — Encounter: Payer: Self-pay | Admitting: Family Medicine

## 2023-12-30 ENCOUNTER — Encounter: Payer: Self-pay | Admitting: Family Medicine

## 2023-12-30 ENCOUNTER — Ambulatory Visit (INDEPENDENT_AMBULATORY_CARE_PROVIDER_SITE_OTHER): Payer: Self-pay | Admitting: Family Medicine

## 2023-12-30 VITALS — BP 136/74 | HR 78 | Ht 68.0 in | Wt 233.7 lb

## 2023-12-30 DIAGNOSIS — Z0000 Encounter for general adult medical examination without abnormal findings: Secondary | ICD-10-CM

## 2023-12-30 DIAGNOSIS — F411 Generalized anxiety disorder: Secondary | ICD-10-CM | POA: Diagnosis not present

## 2023-12-30 DIAGNOSIS — F845 Asperger's syndrome: Secondary | ICD-10-CM | POA: Diagnosis not present

## 2023-12-30 DIAGNOSIS — J45990 Exercise induced bronchospasm: Secondary | ICD-10-CM

## 2023-12-30 NOTE — Progress Notes (Signed)
 Complete physical exam   Patient: John [name]   DOB: 26-Mar-1991   33 y.o. Male  MRN: [id number] Visit Date: 12/30/2023  Today's healthcare provider: Nancyann Perry, MD   Chief Complaint  Patient presents with   Annual Exam    Patient is feeling well today. He is not exercising. He is sleeping well.    Subjective    Discussed the use of AI scribe software for clinical note transcription with the patient, who gave verbal consent to proceed.  History of Present Illness   John [name] is a 33 year old male who presents for an annual physical exam.  He is here for his routine annual physical examination with no specific concerns or issues beyond the usual check-up.  He is currently taking sertraline , which effectively manages his stress levels. He does not require a refill at this time.  No chest pain, heart flutters, shortness of breath, or swelling in his ankles or knees. No issues with hearing or tinnitus. He sleeps well at night without any trouble.  He does not engage in regular exercise but remains physically active by throwing boxes into a truck, which he questions as counting as exercise.     Lab Results  Component Value Date   CHOL 166 06/01/2023   HDL 30 (L) 06/01/2023   LDLCALC 113 (H) 06/01/2023   TRIG 127 06/01/2023   CHOLHDL 5.5 (H) 06/01/2023   Lab Results  Component Value Date   NA 139 06/01/2023   K 4.2 06/01/2023   CREATININE 0.80 06/01/2023   EGFR 122 06/01/2023   GLUCOSE 85 06/01/2023   Lab Results  Component Value Date   HGBA1C 5.1 06/01/2023     Past Medical History:  Diagnosis Date   Asthma    Autism    Past Surgical History:  Procedure Laterality Date   TONSILLECTOMY     Social History   Socioeconomic History   Marital status: Single    Spouse name: Not on file   Number of children: Not on file   Years of education: Not on file   Highest education level: 12th grade  Occupational History    Employer:  [employer]    Comment: Part- time since around 2015  Tobacco Use   Smoking status: Never   Smokeless tobacco: Never  Substance and Sexual Activity   Alcohol use: Yes    Comment: rarely   Drug use: No   Sexual activity: Never    Birth control/protection: None  Other Topics Concern   Not on file  Social History Narrative   Not on file   Social Drivers of Health   Financial Resource Strain: Low Risk  (04/14/2023)   Overall Financial Resource Strain (CARDIA)    Difficulty of Paying Living Expenses: Not very hard  Food Insecurity: No Food Insecurity (04/14/2023)   Hunger Vital Sign    Worried About Running Out of Food in the Last Year: Never true    Ran Out of Food in the Last Year: Never true  Transportation Needs: No Transportation Needs (04/14/2023)   PRAPARE - Administrator, Civil Service (Medical): No    Lack of Transportation (Non-Medical): No  Physical Activity: Unknown (04/14/2023)   Exercise Vital Sign    Days of Exercise per Week: 0 days    Minutes of Exercise per Session: Not on file  Stress: Stress Concern Present (04/14/2023)   [employer] of Occupational Health - Occupational Stress Questionnaire  Feeling of Stress : To some extent  Social Connections: Moderately Isolated (04/14/2023)   Social Connection and Isolation Panel    Frequency of Communication with Friends and Family: More than three times a week    Frequency of Social Gatherings with Friends and Family: Once a week    Attends Religious Services: More than 4 times per year    Active Member of [employer] or Organizations: No    Attends Engineer, structural: Not on file    Marital Status: Never married  Catering manager Violence: Not on file   Family Status  Relation Name Status   Father  Alive   PGM  Alive   PGF  Alive   Mother  Alive   MGF  Deceased   Brother  Alive   MGM  Deceased  No partnership data on file   Family History  Problem Relation Age of Onset    Hypertension Father    Heart attack Paternal Grandmother    Diabetes Paternal Grandfather    Heart disease Paternal Grandfather    Stroke Mother    Hypertension Mother    Heart disease Mother    Diabetes Maternal Grandmother    Heart attack Maternal Grandmother    Allergies  Allergen Reactions   Cefzil [Cefprozil] Rash   Lorabid [Loracarbef] Rash    Patient Care Team: Gasper Nancyann BRAVO, MD as PCP - General (Family Medicine)   Medications: Outpatient Medications Prior to Visit  Medication Sig   albuterol-ipratropium (COMBIVENT) 18-103 MCG/ACT inhaler Inhale 2 puffs into the lungs every 4 (four) hours as needed.    sertraline  (ZOLOFT ) 100 MG tablet Take 1 tablet (100 mg total) by mouth daily.   No facility-administered medications prior to visit.    Review of Systems  Constitutional:  Negative for appetite change, chills and fever.  Respiratory:  Negative for chest tightness, shortness of breath and wheezing.   Cardiovascular:  Negative for chest pain and palpitations.  Gastrointestinal:  Negative for abdominal pain, nausea and vomiting.      Objective    BP 136/74 (BP Location: Left Arm, Patient Position: Sitting, Cuff Size: Normal)   Pulse 78   Ht 5' 8 (1.727 m)   Wt 233 lb 11.2 oz (106 kg)   SpO2 99%   BMI 35.53 kg/m    Physical Exam   General Appearance:    Obese male. Alert, cooperative, in no acute distress, appears stated age  Head:    Normocephalic, without obvious abnormality, atraumatic  Eyes:    PERRL, conjunctiva/corneas clear, EOM's intact, fundi    benign, both eyes       Ears:    Normal TM's and external ear canals, both ears  Nose:   Nares normal, septum midline, mucosa normal, no drainage   or sinus tenderness  Throat:   Lips, mucosa, and tongue normal; teeth and gums normal  Neck:   Supple, symmetrical, trachea midline, no adenopathy;       thyroid:  No enlargement/tenderness/nodules; no carotid   bruit or JVD  Back:     Symmetric, no  curvature, ROM normal, no CVA tenderness  Lungs:     Clear to auscultation bilaterally, respirations unlabored  Chest wall:    No tenderness or deformity  Heart:    Normal heart rate. Normal rhythm. No murmurs, rubs, or gallops.  S1 and S2 normal  Abdomen:     Soft, non-tender, bowel sounds active all four quadrants,    no masses, no organomegaly  Genitalia:    deferred  Rectal:    deferred  Extremities:   All extremities are intact. No cyanosis or edema  Pulses:   2+ and symmetric all extremities  Skin:   Skin color, texture, turgor normal, no rashes or lesions  Lymph nodes:   Cervical, supraclavicular, and axillary nodes normal  Neurologic:   CNII-XII intact. Normal strength, sensation and reflexes      throughout     Last depression screening scores    12/30/2023    2:50 PM 06/01/2023    9:03 AM 04/15/2023    3:40 PM  PHQ 2/9 Scores  PHQ - 2 Score 1 2 2   PHQ- 9 Score  4 4   Last fall risk screening    12/30/2023    2:50 PM  Fall Risk   Falls in the past year? 0  Number falls in past yr: 0  Injury with Fall? 0  Risk for fall due to : No Fall Risks   Last Audit-C alcohol use screening    04/14/2023    8:41 PM  Alcohol Use Disorder Test (AUDIT)  1. How often do you have a drink containing alcohol? 1  2. How many drinks containing alcohol do you have on a typical day when you are drinking? 0  3. How often do you have six or more drinks on one occasion? 0  AUDIT-C Score 1      Patient-reported   A score of 3 or more in women, and 4 or more in men indicates increased risk for alcohol abuse, EXCEPT if all of the points are from question 1   No results found for any visits on 12/30/23.  Assessment & Plan    Routine Health Maintenance and Physical Exam  Exercise Activities and Dietary recommendations  Goals   None     Immunization History  Administered Date(s) Administered   Dtap, Unspecified 12/18/1992, 02/19/1993, 11/26/1993, 03/12/1994, 11/02/1996   HIB,  Unspecified 12/18/1992, 02/19/1993, 11/26/1993, 03/12/1994   Hep A, Unspecified 08/26/2008   Hep B, Unspecified 1993/06/19, 11/20/1992, 03/12/1994   Hepatitis A, Ped/Adol-2 Dose 10/20/2009   Hepatitis B, PED/ADOLESCENT 11/26/1992, 11/20/1992, 03/12/1994   MMR 11/26/1993, 01/30/1998   Meningococcal Acwy, Unspecified 08/26/2008   Polio, Unspecified 12/18/1992, 02/19/1993, 11/26/1993, 11/02/1996   Tdap 10/20/2009    Health Maintenance  Topic Date Due   Pneumococcal Vaccine 66-99 Years old (1 of 2 - PCV) Never done   HPV VACCINES (1 - 3-dose SCDM series) Never done   DTaP/Tdap/Td (6 - Td or Tdap) 10/21/2019   COVID-19 Vaccine (1 - 2024-25 season) Never done   INFLUENZA VACCINE  02/03/2024   Hepatitis B Vaccines  Completed   Hepatitis C Screening  Completed   HIV Screening  Completed   Meningococcal B Vaccine  Aged Out    Discussed health benefits of physical activity, and encouraged him to engage in regular exercise appropriate for his age and condition.     Anxiety Managed with sertraline , effectively controlling stress levels without side effects. - Continue sertraline  as prescribed.  General Health Maintenance Routine exam normal. Weight slightly elevated. Discussed dietary modifications and exercise. - Schedule next annual physical and blood work for next year. - Encourage regular physical activity and weight management. - Advise reduction of sweets and sugary drinks. - Recommend avoiding artificial sweeteners, suggest Stevia.       Return in about 1 year (around 12/29/2024) for Annual Wellness Visit.        Nancyann Perry, MD  [facility] [phone] (phone) [phone] (fax)  [facility]

## 2023-12-30 NOTE — Patient Instructions (Signed)
 Marland Kitchen  Please review the attached list of medications and notify my office if there are any errors.   . Please bring all of your medications to every appointment so we can make sure that our medication list is the same as yours.

## 2024-06-12 ENCOUNTER — Other Ambulatory Visit: Payer: Self-pay

## 2024-06-12 ENCOUNTER — Telehealth: Payer: Self-pay | Admitting: Family Medicine

## 2024-06-12 DIAGNOSIS — F411 Generalized anxiety disorder: Secondary | ICD-10-CM

## 2024-06-12 NOTE — Telephone Encounter (Signed)
 LOV- 12/30/2023 NOV- 12/31/2024 LRF- 1127/2024 Outpatient Medication Detail   Disp Refills Start End   sertraline  (ZOLOFT ) 100 MG tablet 90 tablet 3 06/01/2023 --   Sig - Route: Take 1 tablet (100 mg total) by mouth daily. - Oral   Sent to pharmacy as: sertraline  (ZOLOFT ) 100 MG tablet   E-Prescribing Status: Receipt confirmed by pharmacy (06/01/2023  9:06 AM EST)

## 2024-06-12 NOTE — Telephone Encounter (Signed)
 Converted to rx request

## 2024-06-12 NOTE — Telephone Encounter (Signed)
[facility] faxed refill request for the following medications: ° °sertraline (ZOLOFT) 100 MG tablet ° ° ° °Please advise. °

## 2024-06-13 MED ORDER — SERTRALINE HCL 100 MG PO TABS: 100.0000 mg | ORAL_TABLET | Freq: Every day | ORAL | 3 refills | Status: AC

## 2024-12-31 ENCOUNTER — Encounter: Admitting: Family Medicine
